# Patient Record
Sex: Female | Born: 1985 | Race: Black or African American | Hispanic: No | Marital: Married | Smoking: Never smoker
Health system: Southern US, Community
[De-identification: ages and names within clinical notes are randomized; demographics above are authoritative.]

## PROBLEM LIST (undated history)

## (undated) DIAGNOSIS — F419 Anxiety disorder, unspecified: Secondary | ICD-10-CM

## (undated) DIAGNOSIS — F329 Major depressive disorder, single episode, unspecified: Secondary | ICD-10-CM

## (undated) DIAGNOSIS — Z789 Other specified health status: Secondary | ICD-10-CM

## (undated) DIAGNOSIS — E119 Type 2 diabetes mellitus without complications: Secondary | ICD-10-CM

## (undated) DIAGNOSIS — F32A Depression, unspecified: Secondary | ICD-10-CM

## (undated) HISTORY — PX: NO PAST SURGERIES: SHX2092

## (undated) HISTORY — DX: Type 2 diabetes mellitus without complications: E11.9

---

## 2013-01-12 ENCOUNTER — Inpatient Hospital Stay (HOSPITAL_COMMUNITY)
Admission: AD | Admit: 2013-01-12 | Discharge: 2013-01-12 | Disposition: A | Discharge status: Home / Self Care | Payer: BC Managed Care – PPO | Source: Ambulatory Visit | Attending: Obstetrics and Gynecology | Admitting: Obstetrics and Gynecology

## 2013-01-12 ENCOUNTER — Encounter (HOSPITAL_COMMUNITY): Payer: Self-pay | Admitting: *Deleted

## 2013-01-12 DIAGNOSIS — O039 Complete or unspecified spontaneous abortion without complication: Secondary | ICD-10-CM | POA: Insufficient documentation

## 2013-01-12 DIAGNOSIS — R109 Unspecified abdominal pain: Secondary | ICD-10-CM | POA: Insufficient documentation

## 2013-01-12 HISTORY — DX: Other specified health status: Z78.9

## 2013-01-12 LAB — CBC
HCT: 39.1 % (ref 36.0–46.0)
MCHC: 33 g/dL (ref 30.0–36.0)
MCV: 79.8 fL (ref 78.0–100.0)
RDW: 13.3 % (ref 11.5–15.5)

## 2013-01-12 MED ORDER — KETOROLAC TROMETHAMINE 60 MG/2ML IM SOLN
60.0000 mg | Freq: Once | INTRAMUSCULAR | Status: AC
  Administered 2013-01-12: 60 mg via INTRAMUSCULAR
  Filled 2013-01-12: qty 2

## 2013-01-12 MED ORDER — HYDROMORPHONE HCL PF 1 MG/ML IJ SOLN
1.0000 mg | Freq: Once | INTRAMUSCULAR | Status: DC
  Filled 2013-01-12: qty 1

## 2013-01-12 MED ORDER — OXYCODONE-ACETAMINOPHEN 5-325 MG PO TABS: 1.0000 | ORAL_TABLET | ORAL | Status: DC | PRN

## 2013-01-12 MED ORDER — HYDROMORPHONE HCL PF 1 MG/ML IJ SOLN
1.0000 mg | Freq: Once | INTRAMUSCULAR | Status: AC
  Administered 2013-01-12: 1 mg via INTRAMUSCULAR

## 2013-01-12 MED ORDER — IBUPROFEN 800 MG PO TABS: 800.0000 mg | ORAL_TABLET | Freq: Three times a day (TID) | ORAL | Status: DC | PRN

## 2013-01-12 NOTE — Progress Notes (Signed)
Lummie and her family are coping as well as they can with this unexpected outcome.  They have a strong faith and good support.  They were appreciative of the support and all of the care that they have received over their time here.  They are aware of resources for support in the community.  [employer] Pager, [phone] 11:17 AM   01/12/13 1100  Clinical Encounter Type  Visited With Patient and family together  Visit Type Spiritual support  Referral From Nurse  Spiritual Encounters  Spiritual Needs Emotional;Grief support  Stress Factors  Patient Stress Factors Loss

## 2013-01-12 NOTE — MAU Provider Note (Signed)
History     CSN: [id number]  Arrival date and time: 01/12/13 1478   None     Chief Complaint  Patient presents with  . Vaginal Bleeding   HPI 27 y.o. G1P0 at [redacted]w[redacted]d with vaginal bleeding starting this morning, heavy with clots, cramping. Pt states she passed sac this morning. U/S about 1 month ago in office showing IUP per patient.   Past Medical History  Diagnosis Date  . Medical history non-contributory     Past Surgical History  Procedure Laterality Date  . No past surgeries      History reviewed. No pertinent family history.  History  Substance Use Topics  . Smoking status: Never Smoker   . Smokeless tobacco: Not on file  . Alcohol Use: No    Allergies: No Known Allergies  Prescriptions prior to admission  Medication Sig Dispense Refill  . Prenatal Vit-Fe Fumarate-FA (PRENATAL MULTIVITAMIN) TABS Take 1 tablet by mouth daily at 12 noon.        Review of Systems  Constitutional: Negative.   HENT: Negative.   Respiratory: Negative.   Cardiovascular: Negative.   Gastrointestinal: Positive for abdominal pain (cramping).  Genitourinary:       + bleeding    Physical Exam   Blood pressure 148/84, pulse 92, temperature 98.2 F (36.8 C), temperature source Oral, resp. rate 22, last menstrual period 10/22/2012, SpO2 99.00%.  Physical Exam  Nursing note and vitals reviewed. Constitutional: She is oriented to person, place, and time. She appears well-developed and well-nourished. She appears distressed.  Cardiovascular: Normal rate.   Respiratory: Effort normal. No respiratory distress.  GI: Soft. There is no tenderness.  Genitourinary: There is bleeding (moderate) around the vagina.  Musculoskeletal: Normal range of motion.  Neurological: She is alert and oriented to person, place, and time.  Skin: Skin is warm and dry.  Psychiatric: She has a normal mood and affect.    MAU Course  Procedures Results for orders placed during the hospital encounter of  01/12/13 (from the past 24 hour(s))  CBC     Status: None   Collection Time    01/12/13 10:05 AM      Result Value Range   WBC 7.1  4.0 - 10.5 K/uL   RBC 4.90  3.87 - 5.11 MIL/uL   Hemoglobin 12.9  12.0 - 15.0 g/dL   HCT 29.5  62.1 - 30.8 %   MCV 79.8  78.0 - 100.0 fL   MCH 26.3  26.0 - 34.0 pg   MCHC 33.0  30.0 - 36.0 g/dL   RDW 65.7  84.6 - 96.2 %   Platelets 344  150 - 400 K/uL  ABO/RH     Status: None   Collection Time    01/12/13 10:05 AM      Result Value Range   ABO/RH(D) O POS     Dilaudid 1 mg IM and Toradol 60 mg IM given in MAU for pain  Pt brought in sac she passed this morning - complete amniotic sac with embryo  Assessment and Plan   1. Complete abortion   Rev'd precautions. Meds as below. F/U in office.     Medication List    TAKE these medications       ibuprofen 800 MG tablet  Commonly known as:  ADVIL,MOTRIN  Take 1 tablet (800 mg total) by mouth every 8 (eight) hours as needed for pain.     oxyCODONE-acetaminophen 5-325 MG per tablet  Commonly known as:  PERCOCET/ROXICET  Take 1 tablet by mouth every 4 (four) hours as needed for pain.     prenatal multivitamin Tabs  Take 1 tablet by mouth daily at 12 noon.            Follow-up Information   Call Loney Laurence, MD.   Contact information:   [address] [place] [address] [phone]         Ady Heimann 01/12/2013, 10:02 AM

## 2013-01-12 NOTE — MAU Note (Signed)
Pt brought to room in [facility], states she had started spotting on Monday, this morning the bleeding was more like a period, passed a large clot while on the way here, pt assisted to change clothes, large clot passed in floor.  Pt also C/O cramping.

## 2013-01-12 NOTE — Progress Notes (Signed)
S: Pt presents for increased vaginal bleeding.  She is 10+0 by a office ultrasound ~ 3-4 weeks ago.  Started with spotting this morning and then cramping and bleeding became worse with passage of clot and tissue.  Upon arrival the patient had passage of a large clot that ended up being POCs en caul. Bleeding now is a better.   OCeasar Mons Vitals:   01/12/13 0941 01/12/13 0947 01/12/13 1023 01/12/13 1108  BP: 148/84  132/78 134/78  Pulse: 92  86 86  Temp: 98.2 F (36.8 C)  98.7 F (37.1 C)   TempSrc: Oral     Resp: 22   18  SpO2:  99% 99%    AOx3, crying Abdomen soft Perineum with blood stain.  Small amount of clot at the introitus. No active vaginal bleeding  CBC    Component Value Date/Time   WBC 7.1 01/12/2013 1005   RBC 4.90 01/12/2013 1005   HGB 12.9 01/12/2013 1005   HCT 39.1 01/12/2013 1005   PLT 344 01/12/2013 1005   MCV 79.8 01/12/2013 1005   MCH 26.3 01/12/2013 1005   MCHC 33.0 01/12/2013 1005   RDW 13.3 01/12/2013 1005    Blood Type O+  A/P 1) Completed abortion 2) Bleeding precautions given 3) D/C home and follow-up in office

## 2013-01-17 ENCOUNTER — Ambulatory Visit (HOSPITAL_COMMUNITY)
Admission: RE | Admit: 2013-01-17 | Discharge: 2013-01-17 | Disposition: A | Discharge status: Home / Self Care | Payer: BC Managed Care – PPO | Source: Ambulatory Visit | Attending: Obstetrics and Gynecology | Admitting: Obstetrics and Gynecology

## 2013-01-17 ENCOUNTER — Other Ambulatory Visit (HOSPITAL_COMMUNITY): Payer: Self-pay | Admitting: Obstetrics and Gynecology

## 2013-01-17 DIAGNOSIS — O034 Incomplete spontaneous abortion without complication: Secondary | ICD-10-CM

## 2013-01-17 DIAGNOSIS — R109 Unspecified abdominal pain: Secondary | ICD-10-CM | POA: Insufficient documentation

## 2013-11-17 ENCOUNTER — Encounter (HOSPITAL_COMMUNITY): Payer: Self-pay | Admitting: *Deleted

## 2014-06-25 ENCOUNTER — Encounter (HOSPITAL_COMMUNITY): Payer: Self-pay | Admitting: *Deleted

## 2014-08-24 NOTE — L&D Delivery Note (Signed)
Patient was C/C/+5 and delivered spontaneously with epidural.     NSVD  female infant without abnormalities, Apgars 2, 2, weight P.   The patient had no lacerations. Fundus was firm. EBL was expected amount. Placenta was delivered intact spontaneously within 30 minutes. Vagina was clear.  Baby was transferred to comfort room and held by relatives until expiration.   Delno Blaisdell A

## 2015-04-26 ENCOUNTER — Other Ambulatory Visit: Payer: Self-pay | Admitting: Obstetrics & Gynecology

## 2015-04-26 LAB — OB RESULTS CONSOLE GC/CHLAMYDIA
Chlamydia: NEGATIVE
Gonorrhea: NEGATIVE

## 2015-04-30 LAB — CYTOLOGY - PAP

## 2015-06-11 LAB — OB RESULTS CONSOLE RUBELLA ANTIBODY, IGM: RUBELLA: IMMUNE

## 2015-06-11 LAB — OB RESULTS CONSOLE HIV ANTIBODY (ROUTINE TESTING): HIV: NONREACTIVE

## 2015-06-11 LAB — OB RESULTS CONSOLE RPR: RPR: NONREACTIVE

## 2015-06-11 LAB — OB RESULTS CONSOLE HEPATITIS B SURFACE ANTIGEN: Hepatitis B Surface Ag: NEGATIVE

## 2015-06-19 ENCOUNTER — Encounter: Payer: Managed Care, Other (non HMO) | Attending: Obstetrics and Gynecology

## 2015-06-19 VITALS — Ht 65.0 in | Wt 240.1 lb

## 2015-06-19 DIAGNOSIS — Z713 Dietary counseling and surveillance: Secondary | ICD-10-CM | POA: Insufficient documentation

## 2015-06-19 DIAGNOSIS — O24112 Pre-existing diabetes mellitus, type 2, in pregnancy, second trimester: Secondary | ICD-10-CM

## 2015-06-19 NOTE — Progress Notes (Signed)
  Patient was seen on 06/19/15 for Diabetes self-management complicated by pregnancy. The following learning objectives were met by the patient :   States the definition of Diabetes  States why dietary management is important in controlling blood glucose  Describes the effects of carbohydrates on blood glucose levels  Demonstrates ability to create a balanced meal plan  Demonstrates carbohydrate counting   States when to check blood glucose levels  Demonstrates proper blood glucose monitoring techniques  States the effect of stress and exercise on blood glucose levels  States the importance of limiting caffeine and abstaining from alcohol and smoking  Plan:  Aim for 2 Carb Choices per meal (30 grams) +/- 1 either way for breakfast Aim for 3 Carb Choices per meal (45 grams) +/- 1 either way from lunch and dinner Aim for 1-2 Carbs per snack Begin reading food labels for Total Carbohydrate and sugar grams of foods Consider  increasing your activity level by walking daily as tolerated Begin checking BG before breakfast and 2 hours after first bit of breakfast, lunch and dinner after  as directed by MD  Take medication  as directed by MD  Blood glucose monitor given: Accu-Chek aviva connect Lot # [id number] Exp: 01/22/16 Blood glucose reading: 293 2hpp eggs and other protein (Dr. Philis Pique notified by phone)  Patient instructed to monitor glucose levels: FBS: 60 - <90 2 hour: <120  Patient received the following handouts:  Nutrition Diabetes and Pregnancy  Carbohydrate Counting List  Meal Planning worksheet  Patient will be seen for follow-up as needed.

## 2015-07-05 ENCOUNTER — Other Ambulatory Visit (HOSPITAL_COMMUNITY): Payer: Self-pay | Admitting: Obstetrics and Gynecology

## 2015-07-05 DIAGNOSIS — Z3A16 16 weeks gestation of pregnancy: Secondary | ICD-10-CM

## 2015-07-05 DIAGNOSIS — O24912 Unspecified diabetes mellitus in pregnancy, second trimester: Secondary | ICD-10-CM

## 2015-07-05 DIAGNOSIS — Z3689 Encounter for other specified antenatal screening: Secondary | ICD-10-CM

## 2015-07-09 ENCOUNTER — Encounter (HOSPITAL_COMMUNITY): Payer: Self-pay

## 2015-07-09 ENCOUNTER — Ambulatory Visit (HOSPITAL_COMMUNITY)
Admission: RE | Admit: 2015-07-09 | Discharge: 2015-07-09 | Disposition: A | Discharge status: Home / Self Care | Payer: Managed Care, Other (non HMO) | Source: Ambulatory Visit | Attending: Obstetrics and Gynecology | Admitting: Obstetrics and Gynecology

## 2015-07-09 ENCOUNTER — Other Ambulatory Visit (HOSPITAL_COMMUNITY): Payer: Self-pay | Admitting: Obstetrics and Gynecology

## 2015-07-09 VITALS — BP 130/80 | HR 89 | Wt 241.4 lb

## 2015-07-09 DIAGNOSIS — IMO0002 Reserved for concepts with insufficient information to code with codable children: Secondary | ICD-10-CM

## 2015-07-09 DIAGNOSIS — O24912 Unspecified diabetes mellitus in pregnancy, second trimester: Secondary | ICD-10-CM

## 2015-07-09 DIAGNOSIS — Z3A16 16 weeks gestation of pregnancy: Secondary | ICD-10-CM

## 2015-07-09 DIAGNOSIS — O24112 Pre-existing diabetes mellitus, type 2, in pregnancy, second trimester: Secondary | ICD-10-CM | POA: Insufficient documentation

## 2015-07-09 DIAGNOSIS — Z3689 Encounter for other specified antenatal screening: Secondary | ICD-10-CM

## 2015-07-09 DIAGNOSIS — E119 Type 2 diabetes mellitus without complications: Secondary | ICD-10-CM | POA: Insufficient documentation

## 2015-07-09 DIAGNOSIS — Z0489 Encounter for examination and observation for other specified reasons: Secondary | ICD-10-CM

## 2015-07-09 DIAGNOSIS — O26879 Cervical shortening, unspecified trimester: Secondary | ICD-10-CM

## 2015-07-09 NOTE — Consult Note (Signed)
Maternal Fetal Medicine Consultation  Requesting Provider(s): Carrington Clamp, MD  Reason for consultation: Diabetes in pregnancy  HPI: Stacy Underwood  Is a 29 yo G2P0010, EDD 12/19/2015 who is currently at 16w 5d who was seen for consultation due to an elevated HbA1C of 10.5% in early pregnancy.  She denies any prior history of diabetes.  Stacy Underwood reports that since the diagnosis she has made some "dramatic changes" in her lifestyle and eating habits.  She reports that she is checking her fasting and fingerstick values (at least 4 x daily) and that they have all been in the target range.  She reports that she has had an 8 lb weight loss since making these changes.The patient did not bring in her fingerstick log for review during our visit.  OB History: OB History    Gravida Para Term Preterm AB TAB SAB Ectopic Multiple Living   2               PMH:  Past Medical History  Diagnosis Date  . Medical history non-contributory   . Diabetes mellitus without complication (HCC)     PSH:  Past Surgical History  Procedure Laterality Date  . No past surgeries     Meds:  Prenatal vitamins  Allergies: No Known Allergies   FH:  Family History  Problem Relation Age of Onset  . Diabetes Other     Soc:  Social History   Social History  . Marital Status: Married    Spouse Name: N/A  . Number of Children: N/A  . Years of Education: N/A   Occupational History  . Not on file.   Social History Main Topics  . Smoking status: Never Smoker   . Smokeless tobacco: Not on file  . Alcohol Use: No  . Drug Use: No  . Sexual Activity: Yes   Other Topics Concern  . Not on file   Social History Narrative    Review of Systems: no vaginal bleeding or cramping/contractions, no LOF, no nausea/vomiting. All other systems reviewed and are negative.  PE:  130/80, 241.4 lbs, 89  GEN: well-appearing female ABD: gravid, NT  Ultrasound: Single IUP at 16w 5d Pre-existing diabetes Limited  views of the fetal heart were obtained due to early gestation The remainder of the fetal anatomy appears normal Normal amniotic fluid volume  TVUS - dynamic changes noted.  Cervical length 2.5 cm to 1.1 cm.   A/P: 1) Single IUP at 16w 5d  2) Pre-existing diabetes - Stacy Underwood had a HbA1C value of 10.5 on 10/18 which is consistent with an estimated average glucose of 255 mg/dl.  Given her early gestational age, this is consistent with a diagnosis of type 2 diabetes and not Gestational diabetes.  Stacy Underwood declined to see our diabetic educator and reports that she is checking her fingerstick values 4 x daily and that her sugars have all been in the target range without treatment. She did not bring in her fingerstick log for review today.  I explained that this simply does not make sense given her high HbA1C value.  She insists that she has made some dramatic changes in her eating habits and lifestyle, but I would not expect that any amount of lifestyle changes would help her achieve euglycemia without treatment given that value.  In general, I would go directly to split dose insulin given this HbA1C value, but based on our conversation feel that she would be very resistant to doing this at this  time.  I offered to repeat the HbA1C value in the event that this were a lab error.  Would at least repeat HbA1C values each trimester to get a better idea of her actual blood sugar control. Given there high HbA1C value, she is at some increased risk for birth defects and would recommend a formal fetal echo.  For now, would recommend weekly fingerstick reviews - if the patient is amendable, would encourage her to bring in her glucometer to review the values directly.  I am fairly certain that she will indeed need treatment, but would be hesitant to start anything without documentation of her fingerstick values.  Stacy Underwood may benefit from a short hospitalization for diabetic teaching and patterning, but would likely be  very resistant to this.  3) Cervical shortening - ultrasound findings were reviewed with the patient.  A prescription for vaginal progesterone was given.  Recommend limiting activity - will reevaluate in one week.  Recommendations: 1) Vaginal progesterone  - 200 mg tablets per vagina nightly  2) Cervical length in 1 week.  If cervical length worsens, would consider ultrasound indicated cervical cerclage 3) Fetal echo at 20-22 weeks 4) Serial ultrasound every 4 weeks for growth and to complete anatomy 5) Antenatal testing beginning at 32 weeks 6) Delivery by 39 weeks in the absence of other complications 7) Would recommend baseline labs - CMP, 24-hr urine protein and creatine clearance given what appears to be pre-existing type 2 diabetes 8) Would check HbA1C values at least each trimester 9) Feel that the patient will need therapy - would encourage her to bring in her fingerstick values each week, ideally with her glucometer to review them directly.  If she is resistant to starting insulin, may offer oral glyburide - but will ultimately likely need to be on insulin.   Thank you for the opportunity to be a part of the care of Stacy Underwood. Please contact our office if we can be of further assistance.   I spent approximately 30 minutes with this patient with over 50% of time spent in face-to-face counseling.  Alpha GulaPaul Jaeda Bruso, MD Maternal Fetal Medicine

## 2015-07-11 ENCOUNTER — Encounter (HOSPITAL_COMMUNITY): Payer: Self-pay

## 2015-07-16 ENCOUNTER — Encounter (HOSPITAL_COMMUNITY): Payer: Self-pay

## 2015-07-16 ENCOUNTER — Inpatient Hospital Stay (HOSPITAL_COMMUNITY): Payer: Managed Care, Other (non HMO) | Admitting: Anesthesiology

## 2015-07-16 ENCOUNTER — Other Ambulatory Visit: Payer: Self-pay

## 2015-07-16 ENCOUNTER — Inpatient Hospital Stay (HOSPITAL_COMMUNITY)
Admission: AD | Admit: 2015-07-16 | Discharge: 2015-07-17 | DRG: 781 | Disposition: A | Discharge status: Home / Self Care | Payer: Managed Care, Other (non HMO) | Source: Ambulatory Visit | Attending: Obstetrics and Gynecology | Admitting: Obstetrics and Gynecology

## 2015-07-16 ENCOUNTER — Ambulatory Visit (HOSPITAL_COMMUNITY)
Admission: RE | Admit: 2015-07-16 | Discharge: 2015-07-16 | Disposition: A | Discharge status: Home / Self Care | Payer: Managed Care, Other (non HMO) | Source: Ambulatory Visit | Attending: Obstetrics and Gynecology | Admitting: Obstetrics and Gynecology

## 2015-07-16 ENCOUNTER — Encounter (HOSPITAL_COMMUNITY): Payer: Self-pay | Admitting: *Deleted

## 2015-07-16 ENCOUNTER — Encounter (HOSPITAL_COMMUNITY)
Admission: AD | Disposition: A | Discharge status: Home / Self Care | Payer: Self-pay | Source: Ambulatory Visit | Attending: Obstetrics and Gynecology

## 2015-07-16 DIAGNOSIS — E1165 Type 2 diabetes mellitus with hyperglycemia: Secondary | ICD-10-CM | POA: Diagnosis present

## 2015-07-16 DIAGNOSIS — O26872 Cervical shortening, second trimester: Secondary | ICD-10-CM | POA: Diagnosis present

## 2015-07-16 DIAGNOSIS — Z3A17 17 weeks gestation of pregnancy: Secondary | ICD-10-CM

## 2015-07-16 DIAGNOSIS — O26879 Cervical shortening, unspecified trimester: Secondary | ICD-10-CM | POA: Diagnosis present

## 2015-07-16 DIAGNOSIS — O24410 Gestational diabetes mellitus in pregnancy, diet controlled: Secondary | ICD-10-CM | POA: Diagnosis not present

## 2015-07-16 DIAGNOSIS — O99212 Obesity complicating pregnancy, second trimester: Secondary | ICD-10-CM | POA: Diagnosis present

## 2015-07-16 DIAGNOSIS — O99820 Streptococcus B carrier state complicating pregnancy: Secondary | ICD-10-CM | POA: Diagnosis present

## 2015-07-16 DIAGNOSIS — O3432 Maternal care for cervical incompetence, second trimester: Secondary | ICD-10-CM | POA: Diagnosis present

## 2015-07-16 DIAGNOSIS — Z6841 Body Mass Index (BMI) 40.0 and over, adult: Secondary | ICD-10-CM

## 2015-07-16 DIAGNOSIS — Z833 Family history of diabetes mellitus: Secondary | ICD-10-CM | POA: Diagnosis not present

## 2015-07-16 DIAGNOSIS — O3430 Maternal care for cervical incompetence, unspecified trimester: Secondary | ICD-10-CM | POA: Diagnosis present

## 2015-07-16 HISTORY — DX: Depression, unspecified: F32.A

## 2015-07-16 HISTORY — DX: Anxiety disorder, unspecified: F41.9

## 2015-07-16 HISTORY — DX: Major depressive disorder, single episode, unspecified: F32.9

## 2015-07-16 HISTORY — PX: CERVICAL CERCLAGE: SHX1329

## 2015-07-16 LAB — TYPE AND SCREEN
ABO/RH(D): O POS
ANTIBODY SCREEN: NEGATIVE

## 2015-07-16 LAB — GLUCOSE, CAPILLARY
GLUCOSE-CAPILLARY: 138 mg/dL — AB (ref 65–99)
Glucose-Capillary: 147 mg/dL — ABNORMAL HIGH (ref 65–99)

## 2015-07-16 LAB — CBC
HCT: 38.6 % (ref 36.0–46.0)
Hemoglobin: 12.7 g/dL (ref 12.0–15.0)
MCH: 26.5 pg (ref 26.0–34.0)
MCHC: 32.9 g/dL (ref 30.0–36.0)
MCV: 80.6 fL (ref 78.0–100.0)
PLATELETS: 369 10*3/uL (ref 150–400)
RBC: 4.79 MIL/uL (ref 3.87–5.11)
RDW: 13.7 % (ref 11.5–15.5)
WBC: 9.2 10*3/uL (ref 4.0–10.5)

## 2015-07-16 LAB — PROTEIN / CREATININE RATIO, URINE
CREATININE, URINE: 97 mg/dL
Protein Creatinine Ratio: 0.11 mg/mg{Cre} (ref 0.00–0.15)
TOTAL PROTEIN, URINE: 11 mg/dL

## 2015-07-16 LAB — T4, FREE: FREE T4: 0.72 ng/dL (ref 0.61–1.12)

## 2015-07-16 LAB — TSH: TSH: 0.496 u[IU]/mL (ref 0.350–4.500)

## 2015-07-16 SURGERY — CERCLAGE, CERVIX, VAGINAL APPROACH
Anesthesia: Spinal

## 2015-07-16 MED ORDER — PRENATAL MULTIVITAMIN CH
1.0000 | ORAL_TABLET | Freq: Every day | ORAL | Status: DC
  Administered 2015-07-17: 1 via ORAL
  Filled 2015-07-16: qty 1

## 2015-07-16 MED ORDER — ONDANSETRON HCL 4 MG/2ML IJ SOLN
4.0000 mg | Freq: Once | INTRAMUSCULAR | Status: DC | PRN
Start: 2015-07-16 — End: 2015-07-17

## 2015-07-16 MED ORDER — PROPOFOL 10 MG/ML IV BOLUS
INTRAVENOUS | Status: AC
  Filled 2015-07-16: qty 40

## 2015-07-16 MED ORDER — ZOLPIDEM TARTRATE 5 MG PO TABS: 5.0000 mg | ORAL_TABLET | Freq: Every evening | ORAL | Status: DC | PRN

## 2015-07-16 MED ORDER — PROPOFOL 500 MG/50ML IV EMUL
INTRAVENOUS | Status: DC | PRN
  Administered 2015-07-16: 75 ug/kg/min via INTRAVENOUS

## 2015-07-16 MED ORDER — LIDOCAINE HCL (CARDIAC) 20 MG/ML IV SOLN
INTRAVENOUS | Status: AC
  Filled 2015-07-16: qty 5

## 2015-07-16 MED ORDER — PROPOFOL 10 MG/ML IV BOLUS
INTRAVENOUS | Status: DC | PRN
  Administered 2015-07-16 (×9): 20 mg via INTRAVENOUS

## 2015-07-16 MED ORDER — INDOMETHACIN 25 MG PO CAPS
25.0000 mg | ORAL_CAPSULE | ORAL | Status: DC
  Administered 2015-07-16 – 2015-07-17 (×6): 25 mg via ORAL
  Filled 2015-07-16 (×7): qty 1

## 2015-07-16 MED ORDER — ACETAMINOPHEN 325 MG PO TABS: 650.0000 mg | ORAL_TABLET | ORAL | Status: DC | PRN

## 2015-07-16 MED ORDER — CEFAZOLIN SODIUM-DEXTROSE 2-3 GM-% IV SOLR
INTRAVENOUS | Status: DC | PRN
  Administered 2015-07-16: 2 g via INTRAVENOUS

## 2015-07-16 MED ORDER — LACTATED RINGERS IV SOLN
INTRAVENOUS | Status: DC
  Administered 2015-07-16 (×2): via INTRAVENOUS

## 2015-07-16 MED ORDER — OXYCODONE-ACETAMINOPHEN 5-325 MG PO TABS: 1.0000 | ORAL_TABLET | ORAL | Status: DC | PRN

## 2015-07-16 MED ORDER — FENTANYL CITRATE (PF) 100 MCG/2ML IJ SOLN: 25.0000 ug | INTRAMUSCULAR | Status: DC | PRN

## 2015-07-16 MED ORDER — CALCIUM CARBONATE ANTACID 500 MG PO CHEW: 2.0000 | CHEWABLE_TABLET | ORAL | Status: DC | PRN

## 2015-07-16 MED ORDER — INDOMETHACIN 50 MG PO CAPS
50.0000 mg | ORAL_CAPSULE | Freq: Once | ORAL | Status: AC
  Administered 2015-07-16: 50 mg via ORAL
  Filled 2015-07-16: qty 1

## 2015-07-16 MED ORDER — DOCUSATE SODIUM 100 MG PO CAPS
100.0000 mg | ORAL_CAPSULE | Freq: Every day | ORAL | Status: DC
  Administered 2015-07-17: 100 mg via ORAL
  Filled 2015-07-16 (×2): qty 1

## 2015-07-16 MED ORDER — CEFAZOLIN SODIUM-DEXTROSE 2-3 GM-% IV SOLR
INTRAVENOUS | Status: AC
  Filled 2015-07-16: qty 50

## 2015-07-16 MED ORDER — BUPIVACAINE IN DEXTROSE 0.75-8.25 % IT SOLN
INTRATHECAL | Status: DC | PRN
  Administered 2015-07-16: 1.4 mL via INTRATHECAL

## 2015-07-16 MED ORDER — LIDOCAINE HCL (CARDIAC) 20 MG/ML IV SOLN
INTRAVENOUS | Status: DC | PRN
  Administered 2015-07-16: 40 mg via INTRAVENOUS

## 2015-07-16 SURGICAL SUPPLY — 19 items
CLOTH BEACON ORANGE TIMEOUT ST (SAFETY) IMPLANT
COUNTER NEEDLE 1200 MAGNETIC (NEEDLE) ×3 IMPLANT
GLOVE BIO SURGEON STRL SZ7 (GLOVE) ×3 IMPLANT
GLOVE BIOGEL PI IND STRL 7.0 (GLOVE) ×1 IMPLANT
GLOVE BIOGEL PI INDICATOR 7.0 (GLOVE) ×2
GOWN STRL REUS W/TWL LRG LVL3 (GOWN DISPOSABLE) ×6 IMPLANT
NEEDLE MAYO CATGUT SZ4 (NEEDLE) ×3 IMPLANT
NS IRRIG 1000ML POUR BTL (IV SOLUTION) IMPLANT
PACK VAGINAL MINOR WOMEN LF (CUSTOM PROCEDURE TRAY) ×3 IMPLANT
PAD OB MATERNITY 4.3X12.25 (PERSONAL CARE ITEMS) ×3 IMPLANT
PAD PREP 24X48 CUFFED NSTRL (MISCELLANEOUS) ×3 IMPLANT
SUT ETHIBOND  5 (SUTURE) ×2
SUT ETHIBOND 5 (SUTURE) ×1 IMPLANT
TOWEL OR 17X24 6PK STRL BLUE (TOWEL DISPOSABLE) ×6 IMPLANT
TRAY FOLEY CATH SILVER 14FR (SET/KITS/TRAYS/PACK) ×3 IMPLANT
TUBING NON-CON 1/4 X 20 CONN (TUBING) ×2 IMPLANT
TUBING NON-CON 1/4 X 20' CONN (TUBING) ×1
WATER STERILE IRR 1000ML POUR (IV SOLUTION) ×3 IMPLANT
YANKAUER SUCT BULB TIP NO VENT (SUCTIONS) ×3 IMPLANT

## 2015-07-16 NOTE — Anesthesia Postprocedure Evaluation (Signed)
Anesthesia Post Note  Patient: Stacy Underwood  Procedure(s) Performed: Procedure(s) (LRB): CERCLAGE CERVICAL (N/A)  Patient location during evaluation: PACU Anesthesia Type: Spinal Level of consciousness: awake and alert Pain management: pain level controlled Vital Signs Assessment: post-procedure vital signs reviewed and stable Respiratory status: spontaneous breathing, nonlabored ventilation, respiratory function stable and patient connected to nasal cannula oxygen Cardiovascular status: blood pressure returned to baseline and stable Postop Assessment: No signs of nausea or vomiting Anesthetic complications: no    Last Vitals:  Filed Vitals:   07/16/15 1800 07/16/15 1813  BP: 123/76 127/76  Pulse: 93 90  Temp: 36.8 C 36.7 C  Resp: 17 18    Last Pain:  Filed Vitals:   07/16/15 1822  PainSc: 0-No pain    LLE Motor Response: Purposeful movement LLE Sensation: Increased RLE Motor Response: Purposeful movement RLE Sensation: Increased      Renelda Kilian JENNETTE

## 2015-07-16 NOTE — Anesthesia Procedure Notes (Signed)
Spinal Patient location during procedure: OR Staffing Anesthesiologist: Jayvier Burgher Performed by: anesthesiologist  Preanesthetic Checklist Completed: patient identified, site marked, surgical consent, pre-op evaluation, timeout performed, IV checked, risks and benefits discussed and monitors and equipment checked Spinal Block Patient position: sitting Prep: DuraPrep Patient monitoring: continuous pulse ox, blood pressure and heart rate Approach: midline Location: L3-4 Injection technique: single-shot Needle Needle type: Sprotte  Needle gauge: 24 G Needle length: 9 cm Additional Notes Functioning IV was confirmed and monitors were applied. Sterile prep and drape, including hand hygiene, mask and sterile gloves were used. The patient was positioned and the spine was prepped. The skin was anesthetized with lidocaine.  Free flow of clear CSF was obtained prior to injecting local anesthetic into the CSF.  The spinal needle aspirated freely following injection.  The needle was carefully withdrawn.  The patient tolerated the procedure well. Consent was obtained prior to procedure with all questions answered and concerns addressed. Risks including but not limited to bleeding, infection, nerve damage, paralysis, failed block, inadequate analgesia, allergic reaction, high spinal, itching and headache were discussed and the patient wished to proceed.   Stacy Faiola, MD     

## 2015-07-16 NOTE — Progress Notes (Signed)
Peripad with scan amt bloody discharge

## 2015-07-16 NOTE — Transfer of Care (Signed)
Immediate Anesthesia Transfer of Care Note  Patient: Stacy PortlandJasmine R Elks  Procedure(s) Performed: Procedure(s): CERCLAGE CERVICAL (N/A)  Patient Location: PACU  Anesthesia Type:MAC and Spinal  Level of Consciousness: awake, alert , oriented and patient cooperative  Airway & Oxygen Therapy: Patient Spontanous Breathing  Post-op Assessment: Report given to RN and Post -op Vital signs reviewed and stable  Post vital signs: Reviewed and stable  Last Vitals:  Filed Vitals:   07/16/15 1217  BP: 124/75  Pulse: 88  Temp: 36.7 C  Resp: 20    Complications: No apparent anesthesia complications

## 2015-07-16 NOTE — Op Note (Addendum)
Pre-Operative Diagnosis: 1) 17+5 week intrauterine pregnancy 2) cervical incompetence 3) uncontrolled diabetes mellitus Postoperative diagnoses: same Procedure: Single McDonald cerclage Surgeon: Dr. Waynard ReedsKendra Hilde Churchman Asst.: None Operative findings: The external cervical os was dilated to 1 cm. Transvaginal ultrasound was performed in the OR at the end of the case and showed a cervical length of 3.34 cm with the cerclage stitch noted 2 cm from the external os. Specimen: None EBL: Minimal  Procedure: 29 year old gravida 2 para [id number] African-American female at 3117 weeks +5 days estimated gestational age presents to to the OR for a emergency cerclage placement. The patient was seen by maternal fetal medicine the week prior and noted to have a shortened cervix at 2.2 cm. Today on repeat ultrasound evaluation her cervix was noted to be 1.7 cm in length with a U-shaped funnel. Given the significant change in cervical length with the decision was made to proceed with emergency cerclage placement. Risks/benefits/alternatives of the procedure were discussed with the patient at length and she agreed to proceed. Following the appropriate informed consent the patient was taken to the operating room where spinal anesthesia is administered and found to be adequate. She was placed in the dorsal lithotomy position in [place] stirrups in Trendelenburg. The perineum and vagina were prepped and draped in the normal sterile fashion. A Foley catheter was placed in the bladder. Ring forceps were used to grasp the anterior lip of the cervix following the placement of a weighted speculum and right angle retractors to provide visualization. A #5 Ethibond suture was placed at 12:00, 9:00, 6:00, 3:00 in a pursestring fashion and the suture was tied down at 1:00. 5 knots, followed by an air knot followed by 5 additional knots were placed. A transvaginal ultrasound was performed in the operating room. The cervical length was noted to be 3.34 cm.  The cerclage was noted to be 2 cm from the external cervical os. This completed the procedure. All sponge, lap, needle counts were correct. The patient tolerated the procedure well and was transferred to the postanesthesia care unit in stable condition following the procedure.

## 2015-07-16 NOTE — Progress Notes (Signed)
Pt to OR via bed accompanied by Dr. Gentry RochJudd.

## 2015-07-16 NOTE — H&P (Signed)
Stacy Underwood is a 29 y.o. female presenting for shortened cervix  29 yo G2P0010 @ 17+5 sent form maternal fetal medicine for a shortened cervix. Dr. Harlon FlorWhitaker evaluated the patient today and noted that her cervical length was shortened to 1.7 cm with a U shaped funnel. One Week ago her cervix was noted to be 2.2 cm. Given the shortening of the cervix Dr. Harlon FlorWhitaker recommended proceeding with emergency cerclage.   Additionally, her HgbA1c was 10.5% when her prenatal labs were drawn. She reports normal accu checks at home History OB History    Gravida Para Term Preterm AB TAB SAB Ectopic Multiple Living   2 0 0 0 1 0 1 0 0 0      Past Medical History  Diagnosis Date  . Medical history non-contributory   . Diabetes mellitus without complication (HCC)   . Anxiety   . Depression    Past Surgical History  Procedure Laterality Date  . No past surgeries     Family History: family history includes Diabetes in her other and paternal grandmother. Social History:  reports that she has never smoked. She has never used smokeless tobacco. She reports that she does not drink alcohol or use illicit drugs.   Prenatal Transfer Tool  Maternal Diabetes: Yes:  Diabetes Type:  Pre-pregnancy Genetic Screening: Normal Maternal Ultrasounds/Referrals: Abnormal:  Findings:   Other: Fetal Ultrasounds or other Referrals:  Short cervix Maternal Substance Abuse:  No Significant Maternal Medications:  None Significant Maternal Lab Results:  None Other Comments:  None  ROS    Blood pressure 124/75, pulse 88, temperature 98 F (36.7 C), temperature source Oral, resp. rate 20, height 5\' 5"  (1.651 m), weight 242 lb (109.77 kg), last menstrual period 02/09/2015, unknown if currently breastfeeding. Exam Physical Exam  Prenatal labs: ABO, Rh:  O positive Antibody:  Neg Rubella:  immune RPR:   NR HBsAg:   Neg HIV:   NR GBS:   pos  Assessment/Plan: 1) Admit 2) Hgba1c, accu checks 3) Discussed R/B/A of  watchful obs versus proceeding with emergency cerclage. Pt wishes to proceed 4) SCDs   Travarus Trudo H. 07/16/2015, 3:39 PM

## 2015-07-16 NOTE — Anesthesia Preprocedure Evaluation (Signed)
Anesthesia Evaluation  Patient identified by MRN, date of birth, ID band Patient awake    Reviewed: Allergy & Precautions, NPO status , Patient's Chart, lab work & pertinent test results  History of Anesthesia Complications Negative for: history of anesthetic complications  Airway Mallampati: II  TM Distance: >3 FB Neck ROM: Full    Dental no notable dental hx. (+) Dental Advisory Given   Pulmonary neg pulmonary ROS,    Pulmonary exam normal breath sounds clear to auscultation       Cardiovascular negative cardio ROS Normal cardiovascular exam Rhythm:Regular Rate:Normal     Neuro/Psych PSYCHIATRIC DISORDERS Anxiety Depression negative neurological ROS     GI/Hepatic negative GI ROS, Neg liver ROS,   Endo/Other  diabetes, Poorly ControlledMorbid obesity  Renal/GU negative Renal ROS  negative genitourinary   Musculoskeletal negative musculoskeletal ROS (+)   Abdominal   Peds negative pediatric ROS (+)  Hematology negative hematology ROS (+)   Anesthesia Other Findings   Reproductive/Obstetrics (+) Pregnancy                             Anesthesia Physical Anesthesia Plan  ASA: III  Anesthesia Plan: Spinal   Post-op Pain Management:    Induction:   Airway Management Planned:   Additional Equipment:   Intra-op Plan:   Post-operative Plan:   Informed Consent: I have reviewed the patients History and Physical, chart, labs and discussed the procedure including the risks, benefits and alternatives for the proposed anesthesia with the patient or authorized representative who has indicated his/her understanding and acceptance.   Dental advisory given  Plan Discussed with: CRNA  Anesthesia Plan Comments:         Anesthesia Quick Evaluation

## 2015-07-17 ENCOUNTER — Encounter (HOSPITAL_COMMUNITY): Payer: Self-pay | Admitting: Obstetrics and Gynecology

## 2015-07-17 LAB — HEMOGLOBIN A1C
Hgb A1c MFr Bld: 8.7 % — ABNORMAL HIGH (ref 4.8–5.6)
Mean Plasma Glucose: 203 mg/dL

## 2015-07-17 LAB — GLUCOSE, CAPILLARY
GLUCOSE-CAPILLARY: 140 mg/dL — AB (ref 65–99)
GLUCOSE-CAPILLARY: 169 mg/dL — AB (ref 65–99)
GLUCOSE-CAPILLARY: 135 mg/dL — AB (ref 65–99)

## 2015-07-17 MED ORDER — INSULIN REGULAR HUMAN 100 UNIT/ML IJ SOLN
19.0000 [IU] | Freq: Every morning | INTRAMUSCULAR | Status: DC
  Administered 2015-07-17: 19 [IU] via SUBCUTANEOUS
  Filled 2015-07-17: qty 0.19

## 2015-07-17 MED ORDER — INSULIN REGULAR HUMAN 100 UNIT/ML IJ SOLN: 14.0000 [IU] | Freq: Every day | INTRAMUSCULAR | Status: DC

## 2015-07-17 MED ORDER — INSULIN NPH (HUMAN) (ISOPHANE) 100 UNIT/ML ~~LOC~~ SUSP: 39.0000 [IU] | Freq: Every day | SUBCUTANEOUS | Status: DC

## 2015-07-17 MED ORDER — INSULIN NPH (HUMAN) (ISOPHANE) 100 UNIT/ML ~~LOC~~ SUSP
14.0000 [IU] | Freq: Every day | SUBCUTANEOUS | Status: DC
  Filled 2015-07-17: qty 10

## 2015-07-17 NOTE — Progress Notes (Addendum)
Inpatient Diabetes Program Recommendations  Diabetes Treatment Program Recommendations  ADA Standards of Care 2016 Diabetes in Pregnancy Target Glucose Ranges:  Fasting: 60 - 90 mg/dL Preprandial: 60 - 161105 mg/dL 1 hr postprandial: Less than 140mg /dL (from first bite of meal) 2 hr postprandial: Less than 120 mg/dL (from first bit of meal)Review of Glycemic Control  Diabetes history: Diabetes in pregnancy Current orders for Inpatient glycemic control: NPH-39 units in the am (scheduled for 10:00 am) and NPH and 14 units at HS       Regular - 19 units in the am (scheduled for 10:00 am) and 14 units with supper.       Total daily dose of 86 units insulin (NPH and Regular)  Inpatient Diabetes Program Recommendations:   Recommend using the NPH bid and novolog meal coverage tidwc and correction 2 hrs post-prandial (while in the hospital, this helps determine what insulin doses need to be adjusted to achieve control before discharge.) According to the "Diabetes in pregnancy order set" using weight and gestational age link, the regimen would look like the following to start:  NPH 15 units (no later than 0800) in the am and 15 units at HS Novolog meal coverage of 1 unit for each 5 gms po intake which typically is a set dose for set carbohydrate intake Breakfast- 30 grams = 6 units Lunch and supper-60 grams = 10 units for both lunch and supper.  The correction insulin is used at the 2 hr PP time,  Checking cbgs: Fasting and 2 hrs PP for each meal, 4 times per day. Using the correction insulin to base either NPH or meal coverage doses. The correction dose in this order set is based on a total daily dose of 92 units on the order set (slightly higher than the regimen using NPH and Reg) I am glad to help enter the orders if requested. Glad to follow patient and assist with dosing recommendations.  Spoke with patient on the phone in her patient room. Patient reports that [facility]  class including carbohydrate counting, glucose goals, exercise. RN's to teach patient how to draw up and administer insulins. Asked patient if she would like for me to come over and talk with her for any other concerns and/or questions. She added that she really did not want me to come once she learned how to use the insulins; patient states she under the impression that she would be using an insulin pen.  Neither NPH nor Regular insulins are made for pen injections. Patient would like to go home asap.  Thank you Lenor CoffinAnn Liem Copenhaver, RN, MSN, CDE  Diabetes Inpatient Program Office: [phone] Pager: [phone] 8:00 am to 5:00 pm

## 2015-07-17 NOTE — Progress Notes (Signed)
I spent time with pt and her SO. Stacy Underwood reported having a strong faith and she is relying on her faith to help ease her mind during the remainder of the pregnancy.  She did not wish to talk further at this time, but is aware of our ongoing availability for spiritual and emotional support.  Chaplain Dyanne CarrelKaty Tajh Livsey, Bcc Pager, [phone] 11:45 AM    07/17/15 1100  Clinical Encounter Type  Visited With Patient and family together  Visit Type Spiritual support;Initial

## 2015-07-17 NOTE — Progress Notes (Signed)
Patient ID: Stacy Underwood, female   DOB: Jan 15, 1986, 29 y.o.   MRN: [id number]   POD#1 s/p emergency single McDonald Cerclage  S: Pt comfortable, denies heavy bleeding or cramping. Blood sugars remain elevated. HgbA1C declined from 10.5 to 8.7, however PP BS all elevated above goal. FBS running 130s O:  Filed Vitals:   07/17/15 0515 07/17/15 0518 07/17/15 0905 07/17/15 1056  BP: 122/74 122/74 127/77   Pulse: 94 93 96   Temp: 98.7 F (37.1 C)  98.9 F (37.2 C)   TempSrc: Oral  Oral   Resp: 18  18   Height:      Weight:    233 lb 6.4 oz (105.87 kg)  SpO2: 100% 99% 99%    AOX3, NAD No accessory work of breathing Abd soft  Results for orders placed or performed during the hospital encounter of 07/16/15 (from the past 24 hour(s))  Type and screen [facility] [place]     Status: None   Collection Time: 07/16/15  2:43 PM  Result Value Ref Range   ABO/RH(D) O POS    Antibody Screen NEG    Sample Expiration 07/19/2015   Hemoglobin A1c     Status: Abnormal   Collection Time: 07/16/15  2:43 PM  Result Value Ref Range   Hgb A1c MFr Bld 8.7 (H) 4.8 - 5.6 %   Mean Plasma Glucose 203 mg/dL  TSH     Status: None   Collection Time: 07/16/15  2:43 PM  Result Value Ref Range   TSH 0.496 0.350 - 4.500 uIU/mL  T4, free     Status: None   Collection Time: 07/16/15  2:43 PM  Result Value Ref Range   Free T4 0.72 0.61 - 1.12 ng/dL  CBC     Status: None   Collection Time: 07/16/15  2:43 PM  Result Value Ref Range   WBC 9.2 4.0 - 10.5 K/uL   RBC 4.79 3.87 - 5.11 MIL/uL   Hemoglobin 12.7 12.0 - 15.0 g/dL   HCT 40.9 81.1 - 91.4 %   MCV 80.6 78.0 - 100.0 fL   MCH 26.5 26.0 - 34.0 pg   MCHC 32.9 30.0 - 36.0 g/dL   RDW 78.2 95.6 - 21.3 %   Platelets 369 150 - 400 K/uL  Protein / creatinine ratio, urine     Status: None   Collection Time: 07/16/15  6:35 PM  Result Value Ref Range   Creatinine, Urine 97.00 mg/dL   Total Protein, Urine 11 mg/dL   Protein Creatinine Ratio 0.11  0.00 - 0.15 mg/mg[Cre]  Glucose, capillary     Status: Abnormal   Collection Time: 07/16/15  9:26 PM  Result Value Ref Range   Glucose-Capillary 147 (H) 65 - 99 mg/dL  Glucose, capillary     Status: Abnormal   Collection Time: 07/17/15  5:18 AM  Result Value Ref Range   Glucose-Capillary 140 (H) 65 - 99 mg/dL  Glucose, capillary     Status: Abnormal   Collection Time: 07/17/15 10:50 AM  Result Value Ref Range   Glucose-Capillary 169 (H) 65 - 99 mg/dL   Comment 1 Notify RN    Comment 2 Document in Chart    A/P: 29 yo G2P0 @ 17+6 s/p emergency single McDonald Cerclage with uncontrolled gestational diabetes 1) Patient started on weight based regimen of insulin. Learning how to give herself injections of insulin. If she can successful inject insulin she can go home.  2) Pt states her BS  here have been higher than at home. I have asked her partner to bring her glucose monitor in to see if it needs to be calibrated 3) Insulin regimen:  Breakfast: NPH 39, R 19  Lunch: 0  Dinner: R 14  Bedtime: NPH 14 4) Discussed Checking BS fasting and 1-2 hours PP. Also asked pt to check some 3am sugars to make sure she's not becoming hypoglycemic in the middle of the night

## 2015-07-17 NOTE — Discharge Summary (Signed)
Obstetric Discharge Summary Reason for Admission: Maternal Fetal Medicine Appointment, Shortened cervical length, need for cerclage Prenatal Procedures: cerclage, ultrasound and insulin therapy Intrapartum Procedures: NA, undelivered Postpartum Procedures: Undelivered Complications-Operative and Postpartum: NA undelivered HEMOGLOBIN  Date Value Ref Range Status  07/16/2015 12.7 12.0 - 15.0 g/dL Final   HCT  Date Value Ref Range Status  07/16/2015 38.6 36.0 - 46.0 % Final   Results for orders placed or performed during the hospital encounter of 07/16/15 (from the past 24 hour(s))  [facility]     Status: None   Collection Time: 07/16/15  2:43 PM  Result Value Ref Range   ABO/RH(D) O POS    Antibody Screen NEG    Sample Expiration 07/19/2015   Hemoglobin A1c     Status: Abnormal   Collection Time: 07/16/15  2:43 PM  Result Value Ref Range   Hgb A1c MFr Bld 8.7 (H) 4.8 - 5.6 %   Mean Plasma Glucose 203 mg/dL  TSH     Status: None   Collection Time: 07/16/15  2:43 PM  Result Value Ref Range   TSH 0.496 0.350 - 4.500 uIU/mL  T4, free     Status: None   Collection Time: 07/16/15  2:43 PM  Result Value Ref Range   Free T4 0.72 0.61 - 1.12 ng/dL  CBC     Status: None   Collection Time: 07/16/15  2:43 PM  Result Value Ref Range   WBC 9.2 4.0 - 10.5 K/uL   RBC 4.79 3.87 - 5.11 MIL/uL   Hemoglobin 12.7 12.0 - 15.0 g/dL   HCT 40.938.6 81.136.0 - 91.446.0 %   MCV 80.6 78.0 - 100.0 fL   MCH 26.5 26.0 - 34.0 pg   MCHC 32.9 30.0 - 36.0 g/dL   RDW 78.213.7 95.611.5 - 21.315.5 %   Platelets 369 150 - 400 K/uL  Protein / creatinine ratio, urine     Status: None   Collection Time: 07/16/15  6:35 PM  Result Value Ref Range   Creatinine, Urine 97.00 mg/dL   Total Protein, Urine 11 mg/dL   Protein Creatinine Ratio 0.11 0.00 - 0.15 mg/mg[Cre]  Glucose, capillary     Status: Abnormal   Collection Time: 07/16/15  9:26 PM  Result Value Ref Range   Glucose-Capillary 147 (H) 65 -  99 mg/dL  Glucose, capillary     Status: Abnormal   Collection Time: 07/17/15  5:18 AM  Result Value Ref Range   Glucose-Capillary 140 (H) 65 - 99 mg/dL  Glucose, capillary     Status: Abnormal   Collection Time: 07/17/15 10:50 AM  Result Value Ref Range   Glucose-Capillary 169 (H) 65 - 99 mg/dL   Comment 1 Notify RN    Comment 2 Document in Chart   Glucose, capillary     Status: Abnormal   Collection Time: 07/17/15  1:31 PM  Result Value Ref Range   Glucose-Capillary 135 (H) 65 - 99 mg/dL   Comment 1 Notify RN    Comment 2 Document in Chart     Physical Exam:  General: alert, cooperative and appears stated age Abd soft  Cvx length post procedure: 3.34cm w/ cerclage stitch 2 cm from external os  Discharge Diagnoses: Incompetent cervix and poorly controlled diabetes mellitus  Discharge Information: Date: 07/17/2015 Activity: pelvic rest Diet: diabetic diet Medications: Insulin NPH 39 QAM, 14 QHS. Regular insulin 19 with breakfast, 14 with dinne4 Condition: improved Instructions: refer to practice specific booklet  Discharge to: home Follow-up Information    Follow up with Almon Hercules., MD On 07/22/2015.   Specialty:  Obstetrics and Gynecology   Why:  at [facility] information:   [address] [place] [address] [phone]        Almon Hercules. 07/17/2015, 2:09 PM

## 2015-07-17 NOTE — Progress Notes (Signed)
Patient taught via demonstration how to inject insulin subqutaneously with an insulin pen and drawing up insulin from a vial. Explained Regular and NPH how to draw up if she needs to inject both. Patient demonstrated back and RN answered questions she asked. Patient reports feeling comfortable injecting self with insulin.

## 2015-07-23 ENCOUNTER — Ambulatory Visit (HOSPITAL_COMMUNITY)
Admission: RE | Admit: 2015-07-23 | Discharge: 2015-07-23 | Disposition: A | Discharge status: Home / Self Care | Payer: Managed Care, Other (non HMO) | Source: Ambulatory Visit | Attending: Obstetrics and Gynecology | Admitting: Obstetrics and Gynecology

## 2015-07-23 VITALS — BP 123/77 | HR 93 | Wt 239.6 lb

## 2015-07-23 DIAGNOSIS — O3432 Maternal care for cervical incompetence, second trimester: Secondary | ICD-10-CM | POA: Insufficient documentation

## 2015-07-23 DIAGNOSIS — O26879 Cervical shortening, unspecified trimester: Secondary | ICD-10-CM | POA: Diagnosis present

## 2015-07-23 DIAGNOSIS — O26872 Cervical shortening, second trimester: Secondary | ICD-10-CM | POA: Diagnosis not present

## 2015-07-23 DIAGNOSIS — Z3A00 Weeks of gestation of pregnancy not specified: Secondary | ICD-10-CM | POA: Diagnosis not present

## 2015-07-26 ENCOUNTER — Other Ambulatory Visit: Payer: Self-pay | Admitting: Obstetrics and Gynecology

## 2015-07-30 ENCOUNTER — Encounter (HOSPITAL_COMMUNITY)
Admission: AD | Disposition: A | Discharge status: Home / Self Care | Payer: Self-pay | Source: Ambulatory Visit | Attending: Obstetrics and Gynecology

## 2015-07-30 ENCOUNTER — Ambulatory Visit (HOSPITAL_COMMUNITY)
Admission: RE | Admit: 2015-07-30 | Discharge: 2015-07-30 | Disposition: A | Discharge status: Home / Self Care | Payer: Managed Care, Other (non HMO) | Source: Ambulatory Visit | Attending: Obstetrics and Gynecology | Admitting: Obstetrics and Gynecology

## 2015-07-30 ENCOUNTER — Encounter (HOSPITAL_COMMUNITY): Payer: Self-pay | Admitting: Anesthesiology

## 2015-07-30 ENCOUNTER — Inpatient Hospital Stay (HOSPITAL_COMMUNITY)
Admission: AD | Admit: 2015-07-30 | Discharge: 2015-08-01 | DRG: 781 | Disposition: A | Discharge status: Home / Self Care | Payer: Managed Care, Other (non HMO) | Source: Ambulatory Visit | Attending: Obstetrics and Gynecology | Admitting: Obstetrics and Gynecology

## 2015-07-30 ENCOUNTER — Inpatient Hospital Stay (HOSPITAL_COMMUNITY): Payer: Managed Care, Other (non HMO) | Admitting: Anesthesiology

## 2015-07-30 ENCOUNTER — Encounter (HOSPITAL_COMMUNITY): Payer: Self-pay | Admitting: *Deleted

## 2015-07-30 DIAGNOSIS — O3432 Maternal care for cervical incompetence, second trimester: Secondary | ICD-10-CM | POA: Diagnosis present

## 2015-07-30 DIAGNOSIS — Z3A19 19 weeks gestation of pregnancy: Secondary | ICD-10-CM

## 2015-07-30 DIAGNOSIS — Z794 Long term (current) use of insulin: Secondary | ICD-10-CM | POA: Diagnosis not present

## 2015-07-30 DIAGNOSIS — O26879 Cervical shortening, unspecified trimester: Secondary | ICD-10-CM

## 2015-07-30 DIAGNOSIS — O24112 Pre-existing diabetes mellitus, type 2, in pregnancy, second trimester: Secondary | ICD-10-CM | POA: Diagnosis present

## 2015-07-30 DIAGNOSIS — E1165 Type 2 diabetes mellitus with hyperglycemia: Secondary | ICD-10-CM | POA: Diagnosis present

## 2015-07-30 DIAGNOSIS — O99212 Obesity complicating pregnancy, second trimester: Secondary | ICD-10-CM | POA: Diagnosis present

## 2015-07-30 DIAGNOSIS — Z6839 Body mass index (BMI) 39.0-39.9, adult: Secondary | ICD-10-CM

## 2015-07-30 HISTORY — PX: CERVICAL CERCLAGE: SHX1329

## 2015-07-30 LAB — CBC WITH DIFFERENTIAL/PLATELET
BASOS ABS: 0 10*3/uL (ref 0.0–0.1)
BASOS PCT: 0 %
EOS PCT: 0 %
Eosinophils Absolute: 0 10*3/uL (ref 0.0–0.7)
HCT: 35.7 % — ABNORMAL LOW (ref 36.0–46.0)
Hemoglobin: 11.8 g/dL — ABNORMAL LOW (ref 12.0–15.0)
LYMPHS PCT: 18 %
Lymphs Abs: 1.6 10*3/uL (ref 0.7–4.0)
MCH: 26.6 pg (ref 26.0–34.0)
MCHC: 33.1 g/dL (ref 30.0–36.0)
MCV: 80.6 fL (ref 78.0–100.0)
MONO ABS: 0.4 10*3/uL (ref 0.1–1.0)
Monocytes Relative: 5 %
Neutro Abs: 6.8 10*3/uL (ref 1.7–7.7)
Neutrophils Relative %: 77 %
PLATELETS: 356 10*3/uL (ref 150–400)
RBC: 4.43 MIL/uL (ref 3.87–5.11)
RDW: 13.6 % (ref 11.5–15.5)
WBC: 8.9 10*3/uL (ref 4.0–10.5)

## 2015-07-30 LAB — GLUCOSE, CAPILLARY
GLUCOSE-CAPILLARY: 96 mg/dL (ref 65–99)
GLUCOSE-CAPILLARY: 109 mg/dL — AB (ref 65–99)
Glucose-Capillary: 150 mg/dL — ABNORMAL HIGH (ref 65–99)
Glucose-Capillary: 165 mg/dL — ABNORMAL HIGH (ref 65–99)
Glucose-Capillary: 138 mg/dL — ABNORMAL HIGH (ref 65–99)
Glucose-Capillary: 115 mg/dL — ABNORMAL HIGH (ref 65–99)
Glucose-Capillary: 107 mg/dL — ABNORMAL HIGH (ref 65–99)
Glucose-Capillary: 101 mg/dL — ABNORMAL HIGH (ref 65–99)
Glucose-Capillary: 118 mg/dL — ABNORMAL HIGH (ref 65–99)

## 2015-07-30 LAB — URINALYSIS, ROUTINE W REFLEX MICROSCOPIC
Bilirubin Urine: NEGATIVE
HGB URINE DIPSTICK: NEGATIVE
KETONES UR: 15 mg/dL — AB
Leukocytes, UA: NEGATIVE
Nitrite: NEGATIVE
PROTEIN: NEGATIVE mg/dL
Specific Gravity, Urine: 1.01 (ref 1.005–1.030)
pH: 7 (ref 5.0–8.0)

## 2015-07-30 LAB — COMPREHENSIVE METABOLIC PANEL
ALT: 17 U/L (ref 14–54)
ANION GAP: 6 (ref 5–15)
AST: 14 U/L — AB (ref 15–41)
Albumin: 3.2 g/dL — ABNORMAL LOW (ref 3.5–5.0)
Alkaline Phosphatase: 76 U/L (ref 38–126)
BUN: 8 mg/dL (ref 6–20)
CHLORIDE: 105 mmol/L (ref 101–111)
CO2: 22 mmol/L (ref 22–32)
Calcium: 9.2 mg/dL (ref 8.9–10.3)
Creatinine, Ser: 0.45 mg/dL (ref 0.44–1.00)
GFR calc Af Amer: >60 mL/min (ref >60)
Glucose, Bld: 159 mg/dL — ABNORMAL HIGH (ref 65–99)
POTASSIUM: 3.9 mmol/L (ref 3.5–5.1)
Sodium: 133 mmol/L — ABNORMAL LOW (ref 135–145)
Total Bilirubin: 0.2 mg/dL — ABNORMAL LOW (ref 0.3–1.2)
Total Protein: 6.3 g/dL — ABNORMAL LOW (ref 6.5–8.1)

## 2015-07-30 LAB — URINE MICROSCOPIC-ADD ON
BACTERIA UA: NONE SEEN
WBC UA: NONE SEEN WBC/hpf (ref 0–5)

## 2015-07-30 LAB — TYPE AND SCREEN
ABO/RH(D): O POS
ANTIBODY SCREEN: NEGATIVE

## 2015-07-30 SURGERY — CERCLAGE, CERVIX, VAGINAL APPROACH
Anesthesia: Spinal | Site: Cervix

## 2015-07-30 MED ORDER — INSULIN ASPART 100 UNIT/ML ~~LOC~~ SOLN: 39.0000 [IU] | Freq: Every day | SUBCUTANEOUS | Status: DC

## 2015-07-30 MED ORDER — PROPOFOL 10 MG/ML IV BOLUS
INTRAVENOUS | Status: DC | PRN
  Administered 2015-07-30: 20 mg via INTRAVENOUS

## 2015-07-30 MED ORDER — FENTANYL CITRATE (PF) 100 MCG/2ML IJ SOLN: 25.0000 ug | INTRAMUSCULAR | Status: DC | PRN

## 2015-07-30 MED ORDER — PRENATAL MULTIVITAMIN CH
1.0000 | ORAL_TABLET | Freq: Every day | ORAL | Status: DC
  Administered 2015-07-31 – 2015-08-01 (×2): 1 via ORAL
  Filled 2015-07-30 (×2): qty 1

## 2015-07-30 MED ORDER — DEXTROSE 5 % IV SOLN
3.0000 g | INTRAVENOUS | Status: DC | PRN
  Administered 2015-07-30: 3 g via INTRAVENOUS

## 2015-07-30 MED ORDER — DEXTROSE 50 % IV SOLN: 25.0000 mL | INTRAVENOUS | Status: DC | PRN

## 2015-07-30 MED ORDER — ONDANSETRON HCL 4 MG/2ML IJ SOLN: 4.0000 mg | Freq: Once | INTRAMUSCULAR | Status: DC | PRN

## 2015-07-30 MED ORDER — FOLIC ACID 1 MG PO TABS
1.0000 mg | ORAL_TABLET | Freq: Every day | ORAL | Status: DC
  Administered 2015-07-30 – 2015-07-31 (×2): 1 mg via ORAL
  Filled 2015-07-30 (×4): qty 1

## 2015-07-30 MED ORDER — SODIUM CHLORIDE 0.9 % IV SOLN: INTRAVENOUS | Status: DC

## 2015-07-30 MED ORDER — KETOCONAZOLE 2 % EX CREA
1.0000 "application " | TOPICAL_CREAM | Freq: Every day | CUTANEOUS | Status: DC | PRN
  Filled 2015-07-30: qty 15

## 2015-07-30 MED ORDER — DEXTROSE 5 % IV SOLN
INTRAVENOUS | Status: AC
  Filled 2015-07-30: qty 3000

## 2015-07-30 MED ORDER — SODIUM CHLORIDE 0.9 % IV SOLN
INTRAVENOUS | Status: DC
  Administered 2015-07-30: 125 mL/h via INTRAVENOUS
  Administered 2015-07-30: 12:00:00 via INTRAVENOUS

## 2015-07-30 MED ORDER — SODIUM CHLORIDE 0.9 % IV SOLN
INTRAVENOUS | Status: DC
Start: 2015-07-30 — End: 2015-07-30

## 2015-07-30 MED ORDER — AMOXICILLIN 500 MG PO CAPS: 500.0000 mg | ORAL_CAPSULE | Freq: Three times a day (TID) | ORAL | Status: DC

## 2015-07-30 MED ORDER — FAMOTIDINE IN NACL 20-0.9 MG/50ML-% IV SOLN
20.0000 mg | Freq: Once | INTRAVENOUS | Status: AC
  Administered 2015-07-30: 20 mg via INTRAVENOUS
  Filled 2015-07-30: qty 50

## 2015-07-30 MED ORDER — AZITHROMYCIN 250 MG PO TABS
500.0000 mg | ORAL_TABLET | Freq: Every day | ORAL | Status: DC
  Administered 2015-07-31 – 2015-08-01 (×2): 500 mg via ORAL
  Filled 2015-07-30 (×2): qty 2

## 2015-07-30 MED ORDER — ACETAMINOPHEN 325 MG PO TABS
650.0000 mg | ORAL_TABLET | ORAL | Status: DC | PRN
  Administered 2015-07-30 – 2015-07-31 (×3): 650 mg via ORAL
  Filled 2015-07-30 (×3): qty 2

## 2015-07-30 MED ORDER — SODIUM CHLORIDE 0.9 % IV SOLN
2.0000 g | Freq: Four times a day (QID) | INTRAVENOUS | Status: DC
  Administered 2015-07-30 – 2015-08-01 (×7): 2 g via INTRAVENOUS
  Filled 2015-07-30 (×8): qty 2000

## 2015-07-30 MED ORDER — INSULIN LISPRO 100 UNIT/ML ~~LOC~~ SOLN: 14.0000 [IU] | SUBCUTANEOUS | Status: DC

## 2015-07-30 MED ORDER — DOCUSATE SODIUM 100 MG PO CAPS
100.0000 mg | ORAL_CAPSULE | Freq: Every day | ORAL | Status: DC
  Administered 2015-07-31 – 2015-08-01 (×2): 100 mg via ORAL
  Filled 2015-07-30 (×2): qty 1

## 2015-07-30 MED ORDER — SODIUM CHLORIDE 0.9 % IJ SOLN: 3.0000 mL | Freq: Two times a day (BID) | INTRAMUSCULAR | Status: DC

## 2015-07-30 MED ORDER — DIPHENHYDRAMINE-APAP (SLEEP) 25-500 MG PO TABS: 1.0000 | ORAL_TABLET | Freq: Every evening | ORAL | Status: DC | PRN

## 2015-07-30 MED ORDER — INDOMETHACIN 25 MG PO CAPS
25.0000 mg | ORAL_CAPSULE | Freq: Two times a day (BID) | ORAL | Status: DC
  Administered 2015-07-31 – 2015-08-01 (×3): 25 mg via ORAL
  Filled 2015-07-30 (×3): qty 1

## 2015-07-30 MED ORDER — PHENAZOPYRIDINE HCL 100 MG PO TABS
200.0000 mg | ORAL_TABLET | Freq: Three times a day (TID) | ORAL | Status: DC
  Administered 2015-07-30 – 2015-08-01 (×5): 200 mg via ORAL
  Filled 2015-07-30 (×6): qty 2

## 2015-07-30 MED ORDER — PRENATAL MULTIVITAMIN CH: 1.0000 | ORAL_TABLET | Freq: Every day | ORAL | Status: DC

## 2015-07-30 MED ORDER — PROPOFOL 500 MG/50ML IV EMUL
INTRAVENOUS | Status: DC | PRN
  Administered 2015-07-30: 50 ug/kg/min via INTRAVENOUS

## 2015-07-30 MED ORDER — PROGESTERONE MICRONIZED 200 MG PO CAPS
200.0000 mg | ORAL_CAPSULE | Freq: Every day | ORAL | Status: AC
  Administered 2015-07-30 – 2015-07-31 (×2): 200 mg via ORAL
  Filled 2015-07-30 (×2): qty 1

## 2015-07-30 MED ORDER — DIPHENHYDRAMINE HCL 25 MG PO CAPS: 25.0000 mg | ORAL_CAPSULE | Freq: Every evening | ORAL | Status: DC | PRN

## 2015-07-30 MED ORDER — CITRIC ACID-SODIUM CITRATE 334-500 MG/5ML PO SOLN
30.0000 mL | Freq: Once | ORAL | Status: AC
  Administered 2015-07-30: 30 mL via ORAL
  Filled 2015-07-30: qty 15

## 2015-07-30 MED ORDER — ZOLPIDEM TARTRATE 5 MG PO TABS
5.0000 mg | ORAL_TABLET | Freq: Every evening | ORAL | Status: DC | PRN
  Administered 2015-07-30: 5 mg via ORAL
  Filled 2015-07-30: qty 1

## 2015-07-30 MED ORDER — INSULIN ASPART 100 UNIT/ML ~~LOC~~ SOLN: 19.0000 [IU] | Freq: Every day | SUBCUTANEOUS | Status: DC

## 2015-07-30 MED ORDER — CALCIUM CARBONATE ANTACID 500 MG PO CHEW: 2.0000 | CHEWABLE_TABLET | ORAL | Status: DC | PRN

## 2015-07-30 MED ORDER — PROPOFOL 10 MG/ML IV BOLUS
INTRAVENOUS | Status: AC
  Filled 2015-07-30: qty 40

## 2015-07-30 MED ORDER — INDOMETHACIN 50 MG RE SUPP
50.0000 mg | Freq: Once | RECTAL | Status: AC
  Administered 2015-07-30: 50 mg via RECTAL
  Filled 2015-07-30: qty 1

## 2015-07-30 MED ORDER — SODIUM CHLORIDE 0.9 % IV SOLN
INTRAVENOUS | Status: DC
  Administered 2015-07-30: 0.8 [IU]/h via INTRAVENOUS
  Filled 2015-07-30: qty 2.5

## 2015-07-30 MED ORDER — SODIUM CHLORIDE 0.9 % IJ SOLN: 3.0000 mL | INTRAMUSCULAR | Status: DC | PRN

## 2015-07-30 MED ORDER — SODIUM CHLORIDE 0.9 % IV SOLN: 250.0000 mL | INTRAVENOUS | Status: DC | PRN

## 2015-07-30 MED ORDER — PROGESTERONE 200 MG VA SUPP: 200.0000 mg | Freq: Every day | VAGINAL | Status: DC

## 2015-07-30 MED ORDER — INSULIN ASPART 100 UNIT/ML ~~LOC~~ SOLN
14.0000 [IU] | Freq: Every day | SUBCUTANEOUS | Status: DC
  Administered 2015-07-30: 14 [IU] via SUBCUTANEOUS

## 2015-07-30 SURGICAL SUPPLY — 31 items
BLADE SURG 11 STRL SS (BLADE) IMPLANT
CATH FOLEY 2WAY SLVR 30CC 16FR (CATHETERS) ×3 IMPLANT
CLOTH BEACON ORANGE TIMEOUT ST (SAFETY) ×3 IMPLANT
COUNTER NEEDLE 1200 MAGNETIC (NEEDLE) IMPLANT
ELECT REM PT RETURN 9FT ADLT (ELECTROSURGICAL) ×3
ELECTRODE REM PT RTRN 9FT ADLT (ELECTROSURGICAL) ×1 IMPLANT
GLOVE BIO SURGEON STRL SZ 6.5 (GLOVE) ×2 IMPLANT
GLOVE BIO SURGEON STRL SZ7 (GLOVE) ×3 IMPLANT
GLOVE BIO SURGEONS STRL SZ 6.5 (GLOVE) ×1
GLOVE BIOGEL PI IND STRL 7.0 (GLOVE) ×3 IMPLANT
GLOVE BIOGEL PI INDICATOR 7.0 (GLOVE) ×6
GOWN STRL REUS W/TWL LRG LVL3 (GOWN DISPOSABLE) ×6 IMPLANT
NEEDLE MAYO CATGUT SZ4 (NEEDLE) IMPLANT
NS IRRIG 1000ML POUR BTL (IV SOLUTION) ×3 IMPLANT
PACK VAGINAL MINOR WOMEN LF (CUSTOM PROCEDURE TRAY) ×3 IMPLANT
PAD OB MATERNITY 4.3X12.25 (PERSONAL CARE ITEMS) ×3 IMPLANT
PAD PREP 24X48 CUFFED NSTRL (MISCELLANEOUS) ×3 IMPLANT
PENCIL BUTTON HOLSTER BLD 10FT (ELECTRODE) ×6 IMPLANT
PLUG CATH AND CAP STER (CATHETERS) ×3 IMPLANT
SUT TICRON 2 BLUE 36 GS-21 (SUTURE) ×6 IMPLANT
SUT VIC AB 2-0 CT1 27 (SUTURE) ×2
SUT VIC AB 2-0 CT1 TAPERPNT 27 (SUTURE) ×1 IMPLANT
SYR 30ML LL (SYRINGE) IMPLANT
SYR BULB IRRIGATION 50ML (SYRINGE) ×3 IMPLANT
SYRINGE 10CC LL (SYRINGE) ×3 IMPLANT
TOWEL OR 17X24 6PK STRL BLUE (TOWEL DISPOSABLE) ×6 IMPLANT
TRAY FOLEY CATH SILVER 14FR (SET/KITS/TRAYS/PACK) ×3 IMPLANT
TUBING NON-CON 1/4 X 20 CONN (TUBING) ×2 IMPLANT
TUBING NON-CON 1/4 X 20' CONN (TUBING) ×1
WATER STERILE IRR 1000ML POUR (IV SOLUTION) ×3 IMPLANT
YANKAUER SUCT BULB TIP NO VENT (SUCTIONS) ×3 IMPLANT

## 2015-07-30 NOTE — Brief Op Note (Signed)
07/30/2015  3:45 PM  PATIENT:  Amie PortlandJasmine R Parkerson  29 y.o. female  PRE-OPERATIVE DIAGNOSIS:  incompetent cervix, prolapsing membranes after first cerclage  POST-OPERATIVE DIAGNOSIS:  incompetent cervix, same, now resolved with cerclage  PROCEDURE:  Procedure(s): CERCLAGE CERVICAL (N/A)  SURGEON:  Surgeon(s) and Role:    * Carrington ClampMichelle Marijo Quizon, MD - Primary   ASSISTANTS: Dr. Alpha GulaPaul Whitecar   ANESTHESIA:   spinal  EBL:  Total I/O In: 200 [I.V.:200] Out: 930 [Urine:900; Blood:30]   LOCAL MEDICATIONS USED:  NONE  SPECIMEN:  No Specimen  DISPOSITION OF SPECIMEN:  N/A  COUNTS:  YES  TOURNIQUET:  * No tourniquets in log *  DICTATION: .Note written in EPIC  PLAN OF CARE: Admit to inpatient   PATIENT DISPOSITION:  PACU - hemodynamically stable.   Delay start of Pharmacological VTE agent (>24hrs) due to surgical blood loss or risk of bleeding: not applicable  Findings:cervix dilated to about 2 cm with prior stitch in place.  Membranes were prolapsing through cervix into vagina without fetal parts.  Post placement membranes returned intact to uterus and cervix about 2-3 cm long anteriorly.  Stitch is located posteriorly.  Technique:  After adequate spinal anesthesia was achieved the pt was examined. The pt was then prepped and draped in usual sterile fashion; a foley was placed and bladder emptied.  The cervix was grasped with a fenestrated rings and the the membranes were reduced with a sponge stick with a glove cover.  An attempt was made to keep the membranes in place with a foley but was unsucessful.  All instruments were removed and the bladder was back filled with about 100cc of saline.  The membranes spontaneously reduced after this and were kept away from the cervis with the glove covered sponge stick.   Dissection was undertaken with metzenbaums of the bladder off the cervix at the level of the reflection and continued for about 3 cm above the os, staying close to the cervix and  away from the bladder.  A 0 double loaded Ticron was then used to go from 12:00 to 9:00 and 9:00 to 6:00 with one end and then 12:00 to 3:00 and 3:00 to 6:00 with the other end.  The stitch was tied down tight as the sponge stick was removed.  It was tied on the POSTERIOR CERVIX with an AIR KNOT for identification.  The bladder flap was closed with two figure of eight stitched of 2-0 vicryl. running stitch of 3-0. The bladder was emptied and the pt returned to recovery room in stable condition.      The pt tolerated the procedure well and was returned to the recovery room in stable condition.

## 2015-07-30 NOTE — H&P (Signed)
29 y.o. 8317w5d  G2P0010 seen at MFM clinic today.  Pt is pregestational diabetes.  Otherwise has good fetal movement and no bleeding.  Past Medical History  Diagnosis Date  . Medical history non-contributory   . Diabetes mellitus without complication (HCC)   . Anxiety   . Depression     Past Surgical History  Procedure Laterality Date  . No past surgeries    . Cervical cerclage N/A 07/16/2015    Procedure: CERCLAGE CERVICAL;  Surgeon: Waynard ReedsKendra Ross, MD;  Location: [facility];  Service: Gynecology;  Laterality: N/A;    OB History  Gravida Para Term Preterm AB SAB TAB Ectopic Multiple Living  2 0 0 0 1 1 0 0 0 0     # Outcome Date GA Lbr Len/2nd Weight Sex Delivery Anes PTL Lv  2 Current           1 SAB              Comments: System Generated. Please review and update pregnancy details.      Social History   Social History  . Marital Status: Married    Spouse Name: N/A  . Number of Children: N/A  . Years of Education: N/A   Occupational History  . Not on file.   Social History Main Topics  . Smoking status: Never Smoker   . Smokeless tobacco: Never Used  . Alcohol Use: No  . Drug Use: No  . Sexual Activity: Yes   Other Topics Concern  . Not on file   Social History Narrative   Review of patient's allergies indicates no known allergies.    Prenatal Transfer Tool  Maternal Diabetes: Yes:  Diabetes Type:  Insulin/Medication controlled Genetic Screening: Normal Maternal Ultrasounds/Referrals: Abnormal:  Findings:   Other:no cervix Fetal Ultrasounds or other Referrals:  Referred to Materal Fetal Medicine  Maternal Substance Abuse:  No Significant Maternal Medications:  Meds include: Other: insulin Significant Maternal Lab Results: None  Other PNC: A1C at first visit over 10.  Pt has been on insulin since.      Filed Vitals:   07/30/15 0915  BP: 122/70  Pulse: 93  Resp: 20     Lungs/Cor:  NAD Abdomen:  soft, gravid Ex:  no cords, erythema SVE:  deferred FHTs:   present Toco:  None seen  US cervix with stitch still in place but membranes beginning to prolapse through.  No measurable cervix.    A/P   29 y.o. G2P0010 5617w5d with BDM and no measurable cervix after cerclage placed last week.  Dr. Sherrie Georgeecker and myself have had extensive discussions with pt about poor current prognosis versus risks of rupture with second surgery for placement of rescue cerclage.  Pt understands risks including loss of pregnancy and wishes to proceed.   1.  Will proceed with rescue cerclage today at 2:30 when pt is 8 hours NPO.  Dr. Claudean SeveranceWhitecar is to help me.  2.  Glucomander for NPO status right now.  Sugar is 150 now.    3.  Trendelenberg and SCDs.  Routine labs.    Stacy Underwood A

## 2015-07-30 NOTE — Anesthesia Postprocedure Evaluation (Signed)
Anesthesia Post Note  Patient: Stacy Underwood  Procedure(s) Performed: Procedure(s) (LRB): CERCLAGE CERVICAL (N/A)  Patient location during evaluation: PACU Anesthesia Type: Spinal Level of consciousness: awake and alert Pain management: pain level controlled Vital Signs Assessment: post-procedure vital signs reviewed and stable Respiratory status: spontaneous breathing, nonlabored ventilation, respiratory function stable and patient connected to nasal cannula oxygen Cardiovascular status: blood pressure returned to baseline and stable Postop Assessment: no signs of nausea or vomiting Anesthetic complications: no    Last Vitals:  Filed Vitals:   07/30/15 1700 07/30/15 1715  BP: 121/79 126/81  Pulse: 87 86  Temp:  36.9 C  Resp: 20 18    Last Pain: There were no vitals filed for this visit.               Rhodesia Stanger JENNETTE

## 2015-07-30 NOTE — Transfer of Care (Signed)
Immediate Anesthesia Transfer of Care Note  Patient: Stacy PortlandJasmine R Wachsmuth  Procedure(s) Performed: Procedure(s): CERCLAGE CERVICAL (N/A)  Patient Location: PACU  Anesthesia Type:Spinal  Level of Consciousness: sedated  Airway & Oxygen Therapy: Patient Spontanous Breathing and Patient connected to face mask oxygen  Post-op Assessment: Report given to RN and Post -op Vital signs reviewed and stable  Post vital signs: Reviewed and stable  Last Vitals:  Filed Vitals:   07/30/15 0915  BP: 122/70  Pulse: 93  Temp: 36.9 C  Resp: 20    Complications: No apparent anesthesia complications

## 2015-07-30 NOTE — Anesthesia Procedure Notes (Signed)
Spinal Patient location during procedure: OR Staffing Anesthesiologist: Vasilios Ottaway Performed by: anesthesiologist  Preanesthetic Checklist Completed: patient identified, site marked, surgical consent, pre-op evaluation, timeout performed, IV checked, risks and benefits discussed and monitors and equipment checked Spinal Block Patient position: sitting Prep: DuraPrep Patient monitoring: continuous pulse ox, blood pressure and heart rate Approach: midline Injection technique: single-shot Needle Needle type: Sprotte  Needle gauge: 24 G Needle length: 9 cm Additional Notes Functioning IV was confirmed and monitors were applied. Sterile prep and drape, including hand hygiene, mask and sterile gloves were used. The patient was positioned and the spine was prepped. The skin was anesthetized with lidocaine.  Free flow of clear CSF was obtained prior to injecting local anesthetic into the CSF.  The spinal needle aspirated freely following injection.  The needle was carefully withdrawn.  The patient tolerated the procedure well. Consent was obtained prior to procedure with all questions answered and concerns addressed. Risks including but not limited to bleeding, infection, nerve damage, paralysis, failed block, inadequate analgesia, allergic reaction, high spinal, itching and headache were discussed and the patient wished to proceed.   Lari Linson, MD     

## 2015-07-30 NOTE — Progress Notes (Signed)
CBG 118 prior to dinner while on insulin pump

## 2015-07-30 NOTE — Plan of Care (Signed)
Problem: Education: Goal: Knowledge of disease or condition will improve Outcome: Progressing Pt aware of surgical plan and need, will continue to discuss care  Problem: Coping: Goal: Level of anxiety will decrease Outcome: Not Progressing Pt anxious about hospitalization and interventions, appropriate for situation

## 2015-07-30 NOTE — Anesthesia Preprocedure Evaluation (Signed)
Anesthesia Evaluation  Patient identified by MRN, date of birth, ID band Patient awake    Reviewed: Allergy & Precautions, NPO status , Patient's Chart, lab work & pertinent test results  History of Anesthesia Complications Negative for: history of anesthetic complications  Airway Mallampati: III  TM Distance: >3 FB Neck ROM: Full    Dental no notable dental hx. (+) Dental Advisory Given   Pulmonary neg pulmonary ROS,    Pulmonary exam normal breath sounds clear to auscultation       Cardiovascular negative cardio ROS Normal cardiovascular exam Rhythm:Regular Rate:Normal     Neuro/Psych PSYCHIATRIC DISORDERS Anxiety Depression negative neurological ROS     GI/Hepatic negative GI ROS, Neg liver ROS,   Endo/Other  diabetesMorbid obesity  Renal/GU negative Renal ROS  negative genitourinary   Musculoskeletal negative musculoskeletal ROS (+)   Abdominal   Peds negative pediatric ROS (+)  Hematology negative hematology ROS (+)   Anesthesia Other Findings   Reproductive/Obstetrics (+) Pregnancy                             Anesthesia Physical Anesthesia Plan  ASA: III  Anesthesia Plan: Spinal   Post-op Pain Management:    Induction:   Airway Management Planned:   Additional Equipment:   Intra-op Plan:   Post-operative Plan:   Informed Consent: I have reviewed the patients History and Physical, chart, labs and discussed the procedure including the risks, benefits and alternatives for the proposed anesthesia with the patient or authorized representative who has indicated his/her understanding and acceptance.   Dental advisory given  Plan Discussed with: CRNA  Anesthesia Plan Comments:         Anesthesia Quick Evaluation

## 2015-07-30 NOTE — Op Note (Signed)
07/30/2015  3:45 PM  PATIENT:  Stacy Underwood  29 y.o. female  PRE-OPERATIVE DIAGNOSIS:  incompetent cervix, prolapsing membranes after first cerclage  POST-OPERATIVE DIAGNOSIS:  incompetent cervix, same, now resolved with cerclage  PROCEDURE:  Procedure(s): CERCLAGE CERVICAL (N/A)  SURGEON:  Surgeon(s) and Role:    * Yamaris Cummings, MD - Primary   ASSISTANTS: Dr. Paul Whitecar   ANESTHESIA:   spinal  EBL:  Total I/O In: 200 [I.V.:200] Out: 930 [Urine:900; Blood:30]   LOCAL MEDICATIONS USED:  NONE  SPECIMEN:  No Specimen  DISPOSITION OF SPECIMEN:  N/A  COUNTS:  YES  TOURNIQUET:  * No tourniquets in log *  DICTATION: .Note written in EPIC  PLAN OF CARE: Admit to inpatient   PATIENT DISPOSITION:  PACU - hemodynamically stable.   Delay start of Pharmacological VTE agent (>24hrs) due to surgical blood loss or risk of bleeding: not applicable  Findings:cervix dilated to about 2 cm with prior stitch in place.  Membranes were prolapsing through cervix into vagina without fetal parts.  Post placement membranes returned intact to uterus and cervix about 2-3 cm long anteriorly.  Stitch is located posteriorly.  Technique:  After adequate spinal anesthesia was achieved the pt was examined. The pt was then prepped and draped in usual sterile fashion; a foley was placed and bladder emptied.  The cervix was grasped with a fenestrated rings and the the membranes were reduced with a sponge stick with a glove cover.  An attempt was made to keep the membranes in place with a foley but was unsucessful.  All instruments were removed and the bladder was back filled with about 100cc of saline.  The membranes spontaneously reduced after this and were kept away from the cervis with the glove covered sponge stick.   Dissection was undertaken with metzenbaums of the bladder off the cervix at the level of the reflection and continued for about 3 cm above the os, staying close to the cervix and  away from the bladder.  A 0 double loaded Ticron was then used to go from 12:00 to 9:00 and 9:00 to 6:00 with one end and then 12:00 to 3:00 and 3:00 to 6:00 with the other end.  The stitch was tied down tight as the sponge stick was removed.  It was tied on the POSTERIOR CERVIX with an AIR KNOT for identification.  The bladder flap was closed with two figure of eight stitched of 2-0 vicryl. running stitch of 3-0. The bladder was emptied and the pt returned to recovery room in stable condition.      The pt tolerated the procedure well and was returned to the recovery room in stable condition.       

## 2015-07-30 NOTE — Progress Notes (Signed)
Inpatient Diabetes Program Recommendations  AACE/ADA: New Consensus Statement on Inpatient Glycemic Control (2015)  Target Ranges:  Prepandial:   less than 140 mg/dL      Peak postprandial:   less than 180 mg/dL (1-2 hours)      Critically ill patients:  140 - 180 mg/dL   Review of Glycemic Control Noted patient on GlucoStabilizer for surgery for cerclage. Glucose elevated at the initiation of the drip. Patient may need some basal insulin added to home regimen. Glad to assist in any way.  Thank you Lenor CoffinAnn Preslea Rhodus, RN, MSN, CDE  Diabetes Inpatient Program Office: [phone] Pager: [phone] 8:00 am to 5:00 pm

## 2015-07-30 NOTE — Progress Notes (Signed)
Pt placed in trendelenburg per MD order.

## 2015-07-31 ENCOUNTER — Encounter (HOSPITAL_COMMUNITY): Payer: Self-pay | Admitting: Obstetrics and Gynecology

## 2015-07-31 LAB — GLUCOSE, CAPILLARY
GLUCOSE-CAPILLARY: 158 mg/dL — AB (ref 65–99)
Glucose-Capillary: 137 mg/dL — ABNORMAL HIGH (ref 65–99)
Glucose-Capillary: 167 mg/dL — ABNORMAL HIGH (ref 65–99)

## 2015-07-31 MED ORDER — INSULIN NPH (HUMAN) (ISOPHANE) 100 UNIT/ML ~~LOC~~ SUSP
39.0000 [IU] | Freq: Every day | SUBCUTANEOUS | Status: DC
  Administered 2015-07-31: 39 [IU] via SUBCUTANEOUS
  Filled 2015-07-31: qty 10

## 2015-07-31 MED ORDER — INSULIN ASPART 100 UNIT/ML ~~LOC~~ SOLN
0.0000 [IU] | Freq: Three times a day (TID) | SUBCUTANEOUS | Status: DC
  Administered 2015-07-31: 6 [IU] via SUBCUTANEOUS
  Administered 2015-07-31: 4 [IU] via SUBCUTANEOUS

## 2015-07-31 MED ORDER — POLYETHYLENE GLYCOL 3350 17 G PO PACK
17.0000 g | PACK | Freq: Two times a day (BID) | ORAL | Status: DC
  Administered 2015-07-31 (×2): 17 g via ORAL
  Filled 2015-07-31 (×2): qty 1

## 2015-07-31 MED ORDER — INSULIN ASPART 100 UNIT/ML ~~LOC~~ SOLN
10.0000 [IU] | Freq: Three times a day (TID) | SUBCUTANEOUS | Status: DC
  Administered 2015-07-31: 10 [IU] via SUBCUTANEOUS
  Administered 2015-07-31: 100 [IU] via SUBCUTANEOUS

## 2015-07-31 MED ORDER — INSULIN NPH (HUMAN) (ISOPHANE) 100 UNIT/ML ~~LOC~~ SUSP
14.0000 [IU] | Freq: Every day | SUBCUTANEOUS | Status: DC
  Administered 2015-08-01: 14 [IU] via SUBCUTANEOUS

## 2015-07-31 NOTE — Progress Notes (Signed)
29 y.o. G2P0010 [redacted]w[redacted]d HD#1 admitted for 19WKS, INCOMPETENT CERVIX incompetent cervix .  Pt currently stable with c/o mild cramping overnight.  Scant bleeding.  Good FM.  On review of insulin dosing, patient has a poor understanding of the difference between long and short acting and insulin and has been taking her insulin incorrectly at home.  She has been taking long acting as prescribed, but her short acting only once daily and not at a set time    BP 141/82 mmHg  Pulse 92  Temp(Src) 98.5 F (36.9 C) (Oral)  Resp 20  Ht  (1.651 m)  Wt 107.956 kg (238 lb)  BMI 39.61 kg/m2  SpO2 98%  LMP 02/09/2015   NAD Abd: soft, gravid, non-tender Ex SCDs FHTs  Present qshift Toco  quiet  Results for orders placed or performed during the hospital encounter of 07/30/15 (from the past 24 hour(s))  Glucose, capillary     Status: Abnormal   Collection Time: 07/30/15  9:29 AM  Result Value Ref Range   Glucose-Capillary 150 (H) 65 - 99 mg/dL   Comment 1 Notify RN    Comment 2 Document in Chart   Urinalysis, Routine w reflex microscopic (not at [facility])     Status: Abnormal   Collection Time: 07/30/15  9:30 AM  Result Value Ref Range   Color, Urine YELLOW YELLOW   APPearance CLEAR CLEAR   Specific Gravity, Urine 1.010 1.005 - 1.030   pH 7.0 5.0 - 8.0   Glucose, UA >1000 (A) NEGATIVE mg/dL   Hgb urine dipstick NEGATIVE NEGATIVE   Bilirubin Urine NEGATIVE NEGATIVE   Ketones, ur 15 (A) NEGATIVE mg/dL   Protein, ur NEGATIVE NEGATIVE mg/dL   Nitrite NEGATIVE NEGATIVE   Leukocytes, UA NEGATIVE NEGATIVE  Urine microscopic-add on     Status: Abnormal   Collection Time: 07/30/15  9:30 AM  Result Value Ref Range   Squamous Epithelial / LPF 0-5 (A) NONE SEEN   WBC, UA NONE SEEN 0 - 5 WBC/hpf   RBC / HPF 0-5 0 - 5 RBC/hpf   Bacteria, UA NONE SEEN NONE SEEN  CBC with Differential/Platelet     Status: Abnormal   Collection Time: 07/30/15 10:06 AM  Result Value Ref Range   WBC 8.9 4.0 - 10.5  K/uL   RBC 4.43 3.87 - 5.11 MIL/uL   Hemoglobin 11.8 (L) 12.0 - 15.0 g/dL   HCT 78.2 (L) 95.6 - 21.3 %   MCV 80.6 78.0 - 100.0 fL   MCH 26.6 26.0 - 34.0 pg   MCHC 33.1 30.0 - 36.0 g/dL   RDW 08.6 57.8 - 46.9 %   Platelets 356 150 - 400 K/uL   Neutrophils Relative % 77 %   Neutro Abs 6.8 1.7 - 7.7 K/uL   Lymphocytes Relative 18 %   Lymphs Abs 1.6 0.7 - 4.0 K/uL   Monocytes Relative 5 %   Monocytes Absolute 0.4 0.1 - 1.0 K/uL   Eosinophils Relative 0 %   Eosinophils Absolute 0.0 0.0 - 0.7 K/uL   Basophils Relative 0 %   Basophils Absolute 0.0 0.0 - 0.1 K/uL  Glucose, capillary     Status: Abnormal   Collection Time: 07/30/15 10:44 AM  Result Value Ref Range   Glucose-Capillary 165 (H) 65 - 99 mg/dL   Comment 1 Notify RN    Comment 2 Document in Chart   Comprehensive metabolic panel     Status: Abnormal   Collection Time: 07/30/15 11:00 AM  Result Value Ref Range   Sodium 133 (L) 135 - 145 mmol/L   Potassium 3.9 3.5 - 5.1 mmol/L   Chloride 105 101 - 111 mmol/L   CO2 22 22 - 32 mmol/L   Glucose, Bld 159 (H) 65 - 99 mg/dL   BUN 8 6 - 20 mg/dL   Creatinine, Ser 1.190.45 0.44 - 1.00 mg/dL   Calcium 9.2 8.9 - 14.710.3 mg/dL   Total Protein 6.3 (L) 6.5 - 8.1 g/dL   Albumin 3.2 (L) 3.5 - 5.0 g/dL   AST 14 (L) 15 - 41 U/L   ALT 17 14 - 54 U/L   Alkaline Phosphatase 76 38 - 126 U/L   Total Bilirubin 0.2 (L) 0.3 - 1.2 mg/dL   GFR calc non Af Amer >60 >60 mL/min   GFR calc Af Amer >60 >60 mL/min   Anion gap 6 5 - 15  Glucose, capillary     Status: Abnormal   Collection Time: 07/30/15 11:34 AM  Result Value Ref Range   Glucose-Capillary 138 (H) 65 - 99 mg/dL   Comment 1 Notify RN    Comment 2 Document in Chart   Glucose, capillary     Status: Abnormal   Collection Time: 07/30/15 12:38 PM  Result Value Ref Range   Glucose-Capillary 115 (H) 65 - 99 mg/dL  Glucose, capillary     Status: Abnormal   Collection Time: 07/30/15  1:31 PM  Result Value Ref Range   Glucose-Capillary 107 (H)  65 - 99 mg/dL  Glucose, capillary     Status: None   Collection Time: 07/30/15  2:34 PM  Result Value Ref Range   Glucose-Capillary 96 65 - 99 mg/dL   Comment 1 Notify RN    Comment 2 Document in Chart   Glucose, capillary     Status: Abnormal   Collection Time: 07/30/15  3:49 PM  Result Value Ref Range   Glucose-Capillary 101 (H) 65 - 99 mg/dL   Comment 1 Document in Chart   [facility]     Status: None   Collection Time: 07/30/15  4:26 PM  Result Value Ref Range   ABO/RH(D) O POS    Antibody Screen NEG    Sample Expiration 08/02/2015   Glucose, capillary     Status: Abnormal   Collection Time: 07/30/15  5:08 PM  Result Value Ref Range   Glucose-Capillary 109 (H) 65 - 99 mg/dL  Glucose, capillary     Status: Abnormal   Collection Time: 07/30/15  6:14 PM  Result Value Ref Range   Glucose-Capillary 118 (H) 65 - 99 mg/dL  Glucose, capillary     Status: Abnormal   Collection Time: 07/31/15  8:04 AM  Result Value Ref Range   Glucose-Capillary 158 (H) 65 - 99 mg/dL   Comment 1 Notify RN    Comment 2 Document in Chart     A:  HD#1  6863w6d POD#1 s/p 2nd rescue cerclage for incompetent cervix and prolapsing membranes Uncontrolled type 2 DM  P: Incompetent cervix--per MFM, plan in house observation for at least 48 hours.  Cont 72 hours of indocin and ppx antibiotics.  No s/sx of intrauterine infection at this time.  Uncontrolled diabetes--significant education given to patient this AM WG:NFAOZre:types of insulin and dosing. While in house, will work to improve glycemic control.  Based on weight based calculation, will continue NPH 39/14 per patient's previous home regimen.  Will also add 10 U novolog tid with  meals.  Patient refuses to speak with the diabetic educator again at this time.  Will cont to provide education from MD/nursing.   [facility] GEFFEL [employer]

## 2015-07-31 NOTE — Addendum Note (Signed)
Addendum  created 07/31/15 0742 by Junious SilkMelinda Leilana Mcquire, CRNA   Modules edited: Clinical Notes   Clinical Notes:  File: [id number]

## 2015-07-31 NOTE — Anesthesia Postprocedure Evaluation (Signed)
Anesthesia Post Note  Patient: Stacy Underwood  Procedure(s) Performed: Procedure(s) (LRB): CERCLAGE CERVICAL (N/A)  Patient location during evaluation: Antenatal Anesthesia Type: Spinal Level of consciousness: awake, awake and alert and oriented Pain management: pain level controlled Vital Signs Assessment: post-procedure vital signs reviewed and stable Respiratory status: spontaneous breathing, nonlabored ventilation and respiratory function stable Cardiovascular status: blood pressure returned to baseline Postop Assessment: no headache, no backache, no signs of nausea or vomiting and adequate PO intake Anesthetic complications: no    Last Vitals:  Filed Vitals:   07/31/15 0545 07/31/15 0556  BP: 141/82   Pulse: 99 92  Temp: 36.9 C   Resp:      Last Pain:  Filed Vitals:   07/31/15 0646  PainSc: Asleep                 Stacy Underwood

## 2015-08-01 LAB — GLUCOSE, CAPILLARY
GLUCOSE-CAPILLARY: 70 mg/dL (ref 65–99)
Glucose-Capillary: 79 mg/dL (ref 65–99)
Glucose-Capillary: 104 mg/dL — ABNORMAL HIGH (ref 65–99)

## 2015-08-01 MED ORDER — SODIUM CHLORIDE 0.9 % IJ SOLN
3.0000 mL | Freq: Two times a day (BID) | INTRAMUSCULAR | Status: DC
  Administered 2015-08-01: 3 mL via INTRAVENOUS

## 2015-08-01 MED ORDER — INSULIN NPH (HUMAN) (ISOPHANE) 100 UNIT/ML ~~LOC~~ SUSP
42.0000 [IU] | Freq: Every day | SUBCUTANEOUS | Status: DC
  Administered 2015-08-01: 42 [IU] via SUBCUTANEOUS

## 2015-08-01 MED ORDER — AMOXICILLIN 500 MG PO CAPS: 500.0000 mg | ORAL_CAPSULE | Freq: Three times a day (TID) | ORAL | Status: AC

## 2015-08-01 MED ORDER — INSULIN ASPART 100 UNIT/ML ~~LOC~~ SOLN: 10.0000 [IU] | Freq: Every day | SUBCUTANEOUS | Status: DC

## 2015-08-01 MED ORDER — SODIUM CHLORIDE 0.9 % IJ SOLN: 3.0000 mL | INTRAMUSCULAR | Status: DC | PRN

## 2015-08-01 MED ORDER — INDOMETHACIN 25 MG PO CAPS: 25.0000 mg | ORAL_CAPSULE | Freq: Two times a day (BID) | ORAL | Status: DC

## 2015-08-01 MED ORDER — INSULIN ASPART 100 UNIT/ML ~~LOC~~ SOLN
12.0000 [IU] | Freq: Every day | SUBCUTANEOUS | Status: DC
  Administered 2015-08-01: 12 [IU] via SUBCUTANEOUS

## 2015-08-01 MED ORDER — INSULIN ASPART 100 UNIT/ML ~~LOC~~ SOLN: 13.0000 [IU] | Freq: Every day | SUBCUTANEOUS | Status: DC

## 2015-08-01 NOTE — Progress Notes (Signed)
BG yesterday 158/137/167/70.  Total daily insulin 83U  Will increase to NPH 42U/14, Novolog 08/05/09 Will adjust PM Novolog after fasting BG drawn this AM.

## 2015-08-01 NOTE — Progress Notes (Signed)
Pt without complaints. Wants to go home. She has no vaginal discharge/bleeding/ctxs. She is still on abx and indocine. BSs improved. ' IMP/ stable Plan/ Will discharge to home this pm. Will continue abxs x 24 and indocin x 24 hrs. She has a appt on tues.

## 2015-08-05 ENCOUNTER — Encounter (HOSPITAL_COMMUNITY): Payer: Self-pay | Admitting: *Deleted

## 2015-08-05 ENCOUNTER — Inpatient Hospital Stay (EMERGENCY_DEPARTMENT_HOSPITAL)
Admission: AD | Admit: 2015-08-05 | Discharge: 2015-08-05 | Disposition: A | Discharge status: Home / Self Care | Payer: Managed Care, Other (non HMO) | Source: Ambulatory Visit | Attending: Obstetrics and Gynecology | Admitting: Obstetrics and Gynecology

## 2015-08-05 DIAGNOSIS — O42919 Preterm premature rupture of membranes, unspecified as to length of time between rupture and onset of labor, unspecified trimester: Secondary | ICD-10-CM | POA: Diagnosis not present

## 2015-08-05 DIAGNOSIS — O9989 Other specified diseases and conditions complicating pregnancy, childbirth and the puerperium: Secondary | ICD-10-CM

## 2015-08-05 DIAGNOSIS — O4702 False labor before 37 completed weeks of gestation, second trimester: Secondary | ICD-10-CM | POA: Diagnosis not present

## 2015-08-05 DIAGNOSIS — O2412 Pre-existing diabetes mellitus, type 2, in childbirth: Secondary | ICD-10-CM | POA: Diagnosis not present

## 2015-08-05 DIAGNOSIS — R109 Unspecified abdominal pain: Secondary | ICD-10-CM

## 2015-08-05 DIAGNOSIS — O26892 Other specified pregnancy related conditions, second trimester: Secondary | ICD-10-CM | POA: Diagnosis not present

## 2015-08-05 DIAGNOSIS — O26899 Other specified pregnancy related conditions, unspecified trimester: Secondary | ICD-10-CM

## 2015-08-05 LAB — URINALYSIS, ROUTINE W REFLEX MICROSCOPIC
Bilirubin Urine: NEGATIVE
Glucose, UA: 500 mg/dL — AB
Ketones, ur: NEGATIVE mg/dL
NITRITE: NEGATIVE
PROTEIN: NEGATIVE mg/dL
Specific Gravity, Urine: 1.02 (ref 1.005–1.030)
pH: 6 (ref 5.0–8.0)

## 2015-08-05 LAB — URINE MICROSCOPIC-ADD ON

## 2015-08-05 MED ORDER — OXYCODONE-ACETAMINOPHEN 5-325 MG PO TABS
1.0000 | ORAL_TABLET | Freq: Once | ORAL | Status: AC
  Administered 2015-08-05: 1 via ORAL
  Filled 2015-08-05: qty 1

## 2015-08-05 MED ORDER — INDOMETHACIN 25 MG PO CAPS: 25.0000 mg | ORAL_CAPSULE | Freq: Two times a day (BID) | ORAL | Status: AC

## 2015-08-05 NOTE — Discharge Instructions (Signed)
Cervical Cerclage Cervical cerclage is a surgical procedure for an incompetent cervix. An incompetent cervix is a weak cervix that opens up before labor begins. Cervical cerclage is a procedure in which the cervix is sewn closed during pregnancy.  LET YOUR HEALTH CARE PROVIDER KNOW ABOUT:   Any allergies you have.  All medicines you are taking, including vitamins, herbs, eye drops, creams, and over-the-counter medicines.  Previous problems you or members of your family have had with the use of anesthetics.  Any blood disorders you have.  Previous surgeries you have had.  Medical conditions you have.  Any recent colds or infections. RISKS AND COMPLICATIONS  Generally, this is a safe procedure. However, as with any procedure, problems can occur. Possible problems include:  Infection.  Bleeding.  Rupturing the amniotic sac (membranes).  Going into early labor and delivery.  Problems with the anesthetics.  Infection of the amniotic sac. BEFORE THE PROCEDURE   Ask your health care provider about changing or stopping your medicines.  Do not eat or drink anything for 6-8 hours before the procedure.  Arrange for someone to drive you home after the procedure. PROCEDURE   An IV tube will be placed in your vein. You will be given a sedative to help you relax.  You will be given a medicine that makes you sleep through the procedure (general anesthetic) or a medicine injected into your spine that numbs your body below the waist (spinal or epidural anesthetic). You will be asleep or be numbed through the entire procedure.  A speculum will be placed in your vagina to visualize your cervix.  The cervix is then grasped and stitched closed tightly.  Ultrasound may be used to guide the procedure and monitor the baby. AFTER THE PROCEDURE   You will go to a recovery room where you and your unborn baby are monitored. Once you are awake, stable, and taking fluids well, you will be allowed  to return to your room.  You will usually stay in the hospital overnight.  You may get an injection of progesterone to prevent uterine contractions.  You may be given pain-relieving medicines to take with you when you go home.  Have someone drive you home and stay with you for up to 2 days.   This information is not intended to replace advice given to you by your health care provider. Make sure you discuss any questions you have with your health care provider.   Document Released: 07/23/2008 Document Revised: 08/15/2013 Document Reviewed: 03/01/2013 Elsevier Interactive Patient Education 2016 Elsevier Inc.  

## 2015-08-05 NOTE — MAU Provider Note (Signed)
History     CSN: [id number]  Arrival date and time: 08/05/15 1358   None     Chief Complaint  Patient presents with  . Pelvic Pain  . vaginal pressure    HPI   Stacy Underwood 29 y.o. G2P0010 @ 3149w4d presents to MAU with hx of 2 rescue cerclage, pink vaginal bleeding when she wipes and vaginal pressure.   Past Medical History  Diagnosis Date  . Medical history non-contributory   . Diabetes mellitus without complication (HCC)   . Anxiety   . Depression     Past Surgical History  Procedure Laterality Date  . No past surgeries    . Cervical cerclage N/A 07/16/2015    Procedure: CERCLAGE CERVICAL;  Surgeon: Waynard ReedsKendra Ross, MD;  Location: [facility];  Service: Gynecology;  Laterality: N/A;  . Cervical cerclage N/A 07/30/2015    Procedure: CERCLAGE CERVICAL;  Surgeon: Carrington ClampMichelle Horvath, MD;  Location: [facility];  Service: Gynecology;  Laterality: N/A;    Family History  Problem Relation Age of Onset  . Diabetes Other   . Diabetes Paternal Grandmother     Social History  Substance Use Topics  . Smoking status: Never Smoker   . Smokeless tobacco: Never Used  . Alcohol Use: No    Allergies: No Known Allergies  Prescriptions prior to admission  Medication Sig Dispense Refill Last Dose  . diphenhydramine-acetaminophen (TYLENOL PM) 25-500 MG TABS tablet Take 1 tablet by mouth at bedtime as needed (sleep).   Past Month at Unknown time  . folic acid (FOLVITE) 800 MCG tablet Take 800 mcg by mouth daily.   07/29/2015 at Unknown time  . indomethacin (INDOCIN) 25 MG capsule Take 1 capsule (25 mg total) by mouth 2 (two) times daily with a meal. 3 capsule 0   . Prenatal Vit-Fe Fumarate-FA (PRENATAL MULTIVITAMIN) TABS Take 1 tablet by mouth daily at 12 noon.   07/29/2015 at Unknown time  . progesterone 200 MG SUPP Place 200 mg vaginally at bedtime.   07/29/2015 at Unknown time    Review of Systems  Gastrointestinal: Positive for abdominal pain.  Genitourinary:       Vaginal pressure    Physical Exam   Blood pressure 135/77, pulse 97, temperature 97.8 F (36.6 C), resp. rate 18, last menstrual period 02/09/2015, unknown if currently breastfeeding.  Physical Exam  Nursing note and vitals reviewed. Constitutional: She is oriented to person, place, and time. She appears well-developed and well-nourished. No distress.  HENT:  Head: Normocephalic and atraumatic.  Neck: Normal range of motion.  Cardiovascular: Normal rate and regular rhythm.   Respiratory: Effort normal and breath sounds normal.  GI: Soft. Bowel sounds are normal.  Genitourinary: Vagina normal.  Cerclage stitch intact; no memebranes seen; no active bleeding seen  Musculoskeletal: Normal range of motion. She exhibits no edema.  Neurological: She is alert and oriented to person, place, and time.  Skin: Skin is warm and dry.  Psychiatric: She has a normal mood and affect. Her behavior is normal. Judgment and thought content normal.   Results for orders placed or performed during the hospital encounter of 08/05/15 (from the past 24 hour(s))  Urinalysis, Routine w reflex microscopic ([facility])     Status: Abnormal   Collection Time: 08/05/15  2:00 PM  Result Value Ref Range   Color, Urine YELLOW YELLOW   APPearance CLEAR CLEAR   Specific Gravity, Urine 1.020 1.005 - 1.030   pH 6.0 5.0 - 8.0   Glucose, UA  500 (A) NEGATIVE mg/dL   Hgb urine dipstick SMALL (A) NEGATIVE   Bilirubin Urine NEGATIVE NEGATIVE   Ketones, ur NEGATIVE NEGATIVE mg/dL   Protein, ur NEGATIVE NEGATIVE mg/dL   Nitrite NEGATIVE NEGATIVE   Leukocytes, UA TRACE (A) NEGATIVE  Urine microscopic-add on     Status: Abnormal   Collection Time: 08/05/15  2:00 PM  Result Value Ref Range   Squamous Epithelial / LPF 6-30 (A) NONE SEEN   WBC, UA 0-5 0 - 5 WBC/hpf   RBC / HPF 0-5 0 - 5 RBC/hpf   Bacteria, UA FEW (A) NONE SEEN   Urine-Other MUCOUS PRESENT    MAU Course  Procedures  MDM Gentle spec exam reveals intact cerclage. EFM  does not show any uterine activity but pt states she is having lower abdominal cramping. Discussed POC with Dr Dareen Piano and pt will be discharged to home with 24 hours of Indocin. Pt needs to make an appointment in the office for this week  Assessment and Plan  Incompetent cervix Abdominal cramping in second trimester Indocin  q 6 hours x 24 hours Discharge   [name],[name] 08/05/2015, 2:20 PM

## 2015-08-05 NOTE — MAU Note (Signed)
Pt presents to MAU with complaints of pelvic pressure. Had a cerclage placed on November the 22nd by Dr Tenny Crawoss and the cerclage was corrected on December the 6th by Dr Henderson CloudHorvath. Pt was discharged from antenatal after cerclage and was discharged on Thursday December the 8th.Reports scant amount of vaginal bleeding when she wipes.

## 2015-08-06 ENCOUNTER — Other Ambulatory Visit (HOSPITAL_COMMUNITY): Payer: Self-pay | Admitting: Maternal and Fetal Medicine

## 2015-08-06 ENCOUNTER — Encounter (HOSPITAL_COMMUNITY): Payer: Self-pay

## 2015-08-06 ENCOUNTER — Ambulatory Visit (HOSPITAL_COMMUNITY)
Admission: RE | Admit: 2015-08-06 | Discharge: 2015-08-06 | Disposition: A | Discharge status: Home / Self Care | Payer: Managed Care, Other (non HMO) | Source: Ambulatory Visit | Attending: Obstetrics and Gynecology | Admitting: Obstetrics and Gynecology

## 2015-08-06 ENCOUNTER — Encounter (HOSPITAL_COMMUNITY): Payer: Self-pay | Admitting: *Deleted

## 2015-08-06 ENCOUNTER — Inpatient Hospital Stay (EMERGENCY_DEPARTMENT_HOSPITAL)
Admission: AD | Admit: 2015-08-06 | Discharge: 2015-08-06 | Disposition: A | Discharge status: Home / Self Care | Payer: Managed Care, Other (non HMO) | Source: Ambulatory Visit | Attending: Obstetrics and Gynecology | Admitting: Obstetrics and Gynecology

## 2015-08-06 DIAGNOSIS — O4692 Antepartum hemorrhage, unspecified, second trimester: Secondary | ICD-10-CM

## 2015-08-06 DIAGNOSIS — O26872 Cervical shortening, second trimester: Secondary | ICD-10-CM

## 2015-08-06 DIAGNOSIS — Z3A20 20 weeks gestation of pregnancy: Secondary | ICD-10-CM

## 2015-08-06 DIAGNOSIS — R109 Unspecified abdominal pain: Secondary | ICD-10-CM

## 2015-08-06 DIAGNOSIS — IMO0002 Reserved for concepts with insufficient information to code with codable children: Secondary | ICD-10-CM

## 2015-08-06 DIAGNOSIS — O24419 Gestational diabetes mellitus in pregnancy, unspecified control: Secondary | ICD-10-CM

## 2015-08-06 DIAGNOSIS — O4702 False labor before 37 completed weeks of gestation, second trimester: Secondary | ICD-10-CM | POA: Diagnosis not present

## 2015-08-06 DIAGNOSIS — O26899 Other specified pregnancy related conditions, unspecified trimester: Secondary | ICD-10-CM

## 2015-08-06 DIAGNOSIS — O3432 Maternal care for cervical incompetence, second trimester: Secondary | ICD-10-CM

## 2015-08-06 DIAGNOSIS — Z0489 Encounter for examination and observation for other specified reasons: Secondary | ICD-10-CM

## 2015-08-06 LAB — GLUCOSE, CAPILLARY: GLUCOSE-CAPILLARY: 121 mg/dL — AB (ref 65–99)

## 2015-08-06 MED ORDER — NIFEDIPINE 10 MG PO CAPS
10.0000 mg | ORAL_CAPSULE | Freq: Once | ORAL | Status: AC
  Administered 2015-08-06: 10 mg via ORAL
  Filled 2015-08-06: qty 1

## 2015-08-06 MED ORDER — NIFEDIPINE 10 MG PO CAPS
20.0000 mg | ORAL_CAPSULE | Freq: Once | ORAL | Status: AC
  Administered 2015-08-06: 20 mg via ORAL
  Filled 2015-08-06: qty 2

## 2015-08-06 MED ORDER — NIFEDIPINE ER OSMOTIC RELEASE 30 MG PO TB24: 30.0000 mg | ORAL_TABLET | Freq: Every day | ORAL | Status: DC

## 2015-08-06 NOTE — Discharge Instructions (Signed)
° ° °Preterm Labor Information °Preterm labor is when labor starts at less than 37 weeks of pregnancy. The normal length of a pregnancy is 39 to 41 weeks. °CAUSES °Often, there is no identifiable underlying cause as to why a woman goes into preterm labor. One of the most common known causes of preterm labor is infection. Infections of the uterus, cervix, vagina, amniotic sac, bladder, kidney, or even the lungs (pneumonia) can cause labor to start. Other suspected causes of preterm labor include:  °· Urogenital infections, such as yeast infections and bacterial vaginosis.   °· Uterine abnormalities (uterine shape, uterine septum, fibroids, or bleeding from the placenta).   °· A cervix that has been operated on (it may fail to stay closed).   °· Malformations in the fetus.   °· Multiple gestations (twins, triplets, and so on).   °· Breakage of the amniotic sac.   °RISK FACTORS °· Having a previous history of preterm labor.   °· Having premature rupture of membranes (PROM).   °· Having a placenta that covers the opening of the cervix (placenta previa).   °· Having a placenta that separates from the uterus (placental abruption).   °· Having a cervix that is too weak to hold the fetus in the uterus (incompetent cervix).   °· Having too much fluid in the amniotic sac (polyhydramnios).   °· Taking illegal drugs or smoking while pregnant.   °· Not gaining enough weight while pregnant.   °· Being younger than 18 and older than 27 years old.   °· Having a low socioeconomic status.   °· Being African American. °SYMPTOMS °Signs and symptoms of preterm labor include:  °· Menstrual-like cramps, abdominal pain, or back pain. °· Uterine contractions that are regular, as frequent as six in an hour, regardless of their intensity (may be mild or painful). °· Contractions that start on the top of the uterus and spread down to the lower abdomen and back.   °· A sense of increased pelvic pressure.   °· A watery or bloody mucus discharge  that comes from the vagina.   °TREATMENT °Depending on the length of the pregnancy and other circumstances, your health care provider may suggest bed rest. If necessary, there are medicines that can be given to stop contractions and to mature the fetal lungs. If labor happens before 34 weeks of pregnancy, a prolonged hospital stay may be recommended. Treatment depends on the condition of both you and the fetus.  °WHAT SHOULD YOU DO IF YOU THINK YOU ARE IN PRETERM LABOR? °Call your health care provider right away. You will need to go to the hospital to get checked immediately. °HOW CAN YOU PREVENT PRETERM LABOR IN FUTURE PREGNANCIES? °You should:  °· Stop smoking if you smoke.  °· Maintain healthy weight gain and avoid chemicals and drugs that are not necessary. °· Be watchful for any type of infection. °· Inform your health care provider if you have a known history of preterm labor. °  °This information is not intended to replace advice given to you by your health care provider. Make sure you discuss any questions you have with your health care provider. °  °Document Released: 10/31/2003 Document Revised: 04/12/2013 Document Reviewed: 09/12/2012 °Elsevier Interactive Patient Education ©2016 Elsevier Inc. °Cervical Insufficiency °Cervical insufficiency is when the cervix is weak and starts to open (dilate) and thin (efface) before the pregnancy is at term and without labor starting. This is also called incompetent cervix. It can happen in the second or third trimester when the fetus starts putting pressure on the cervix. Cervical insufficiency can lead to a   miscarriage, preterm premature rupture of the membranes (PPROM), or having the baby early (preterm birth).  °RISK FACTORS °You may be more likely to develop cervical insufficiency if: °· You have a shorter cervix than normal. °· Damage or injury occurred to your cervix from a past pregnancy or surgery. °· You were born with a cervical defect. °· You have had a  procedure done on the cervix, such as cervical biopsy. °· You have a history of cervical insufficiency. °· You have a history of PPROM. °· You have ended several past pregnancies through abortion. °· You were exposed to the drug diethylstilbestrol (DES). °SYMPTOMS °Often times, women do not have any symptoms. Other times, women may only have mild symptoms that often start between week 14 through 20. The symptoms may last several days or weeks. These symptoms include: °· Light spotting or bleeding from the vagina. °· Pelvic pressure. °· A change in vaginal discharge, such as discharge that changes from clear, white, or light yellow to pink or tan. °· Back pain. °· Abdominal pain or cramping. °DIAGNOSIS °Cervical insufficiency cannot be diagnosed before you become pregnant. Once you are pregnant, your health care provider will ask about your medical history and if you have had any problems in past pregnancies. Tell your health care provider about any procedures performed on your cervix or if you have a history of miscarriages or cervical insufficiency. If your health care provider thinks you are at high risk for cervical insufficiency or show signs of cervical insufficiency, he or she may: °· Perform a pelvic exam. This will check for: °¨ The presence of the membranes (amniotic sac) coming out of the cervix. °¨ Cervical abnormalities. °¨ Cervical injuries. °¨ The presence of contractions. °· Perform an ultrasonography (commonly called ultrasound) to measure the length and thickness of the cervix. °TREATMENT °If you have been diagnosed with cervical insufficiency, your health care provider may recommend: °· Limiting physical activity. °· Bed rest at home or in the hospital. °· Pelvic rest, which means no sexual intercourse or placing anything in the vagina. °· Cerclage to sew the cervix closed and prevent it from opening too early. The stitches (sutures) are removed between weeks 36 and 38 to avoid problems during  labor. °Cerclage may be recommended during pregnancy if you have had a history of miscarriages or preterm births without a known cause. It may also be recommended if you have a short cervix that was identified by ultrasound or if your health care provider has found that your cervix has dilated before 24 weeks of pregnancy. Limiting physical activity and bed rest may or may not help prevent a preterm birth. °WHEN SHOULD YOU SEEK IMMEDIATE MEDICAL CARE?  °Seek immediate medical care if you show any symptoms of cervical insufficiency. You will need to go to the hospital to get checked immediately. °  °This information is not intended to replace advice given to you by your health care provider. Make sure you discuss any questions you have with your health care provider. °  °Document Released: 08/10/2005 Document Revised: 08/31/2014 Document Reviewed: 10/17/2012 °Elsevier Interactive Patient Education ©2016 Elsevier Inc. ° °

## 2015-08-06 NOTE — ED Notes (Signed)
Report called to Ginger Morris, RN, Patient taken to MAU via wheelchair.

## 2015-08-06 NOTE — Discharge Summary (Signed)
Pt is a 29 y/o black female, G2P0010 at [redacted] wks EGA who was seen in the MFM on 07/30/15 for a follow up visit. She was found to have prolapsing membranes through a previously placed cerclage. She was taken to the OR for a second cerclage placement by Dr. Henderson CloudHorvath. Please see Op Note. She tolerated the procedure well. She was txd with Indocine and abxs for 72 hours after placement. While in the hospital her diabetes mgmt was improved. At the time of discharge she had no ctxs, bleeding. Baby was doing well.  She was discharged to home with another 4 days of abxs and 24 hours of indocine.Marland Kitchen. She will follow up in the office.

## 2015-08-06 NOTE — MAU Note (Signed)
Urine in lab 

## 2015-08-06 NOTE — MAU Provider Note (Addendum)
History     CSN: [id number] Arrival date and time: 08/06/15 [id number] First Provider Initiated Contact with Patient 08/06/15 [phone]      Chief Complaint  Patient presents with  . Abdominal Pain   HPI Patient is 29 y.o. G2P0010 865w5d here with complaints of contractions every 15 minutes, constantly since yesterday. She was seen at [facility] and they sent her here. She denies VB, LOF and discharge. She reports FM. She reports she has been complaint with indomethacin but it has not helped. She reports the contractions have been keeping her up.    OB History    Gravida Para Term Preterm AB TAB SAB Ectopic Multiple Living   2 0 0 0 1 0 1 0 0 0       Past Medical History  Diagnosis Date  . Medical history non-contributory   . Diabetes mellitus without complication (HCC)   . Anxiety   . Depression     Past Surgical History  Procedure Laterality Date  . No past surgeries    . Cervical cerclage N/A 07/16/2015    Procedure: CERCLAGE CERVICAL;  Surgeon: Waynard ReedsKendra Ross, MD;  Location: [facility];  Service: Gynecology;  Laterality: N/A;  . Cervical cerclage N/A 07/30/2015    Procedure: CERCLAGE CERVICAL;  Surgeon: Carrington ClampMichelle Nonnie Pickney, MD;  Location: [facility];  Service: Gynecology;  Laterality: N/A;    Family History  Problem Relation Age of Onset  . Diabetes Other   . Diabetes Paternal Grandmother     Social History  Substance Use Topics  . Smoking status: Never Smoker   . Smokeless tobacco: Never Used  . Alcohol Use: No    Allergies: No Known Allergies  Prescriptions prior to admission  Medication Sig Dispense Refill Last Dose  . diphenhydramine-acetaminophen (TYLENOL PM) 25-500 MG TABS tablet Take 1 tablet by mouth at bedtime as needed (sleep).   Taking  . folic acid (FOLVITE) 800 MCG tablet Take 800 mcg by mouth daily.   Taking  . indomethacin (INDOCIN) 25 MG capsule Take 1 capsule (25 mg total) by mouth 2 (two) times daily with a meal. 2 capsule 0 Taking  . Prenatal Vit-Fe Fumarate-FA  (PRENATAL MULTIVITAMIN) TABS Take 1 tablet by mouth daily at 12 noon.   Taking  . progesterone 200 MG SUPP Place 200 mg vaginally at bedtime.   Taking    Review of Systems  Constitutional: Negative for fever and chills.  Eyes: Negative for blurred vision and double vision.  Respiratory: Negative for cough and shortness of breath.   Cardiovascular: Negative for chest pain and orthopnea.  Gastrointestinal: Positive for abdominal pain. Negative for nausea and vomiting.  Genitourinary: Negative for dysuria, frequency and flank pain.  Musculoskeletal: Negative for myalgias.  Skin: Negative for rash.  Neurological: Negative for dizziness, tingling, weakness and headaches.  Endo/Heme/Allergies: Does not bruise/bleed easily.  Psychiatric/Behavioral: Negative for depression and suicidal ideas. The patient is not nervous/anxious.    Physical Exam   Blood pressure 124/75, pulse 92, temperature 98.1 F (36.7 C), temperature source Oral, resp. rate 20, last menstrual period 02/09/2015, unknown if currently breastfeeding.  Physical Exam  Nursing note and vitals reviewed. Constitutional: She is oriented to person, place, and time. She appears well-developed and well-nourished. No distress.  Pregnant female. Painful contraction, breathing through them.   HENT:  Head: Normocephalic and atraumatic.  Eyes: Conjunctivae are normal. No scleral icterus.  Neck: Normal range of motion. Neck supple.  Cardiovascular: Normal rate and intact distal pulses.   Respiratory: Effort normal. She  exhibits no tenderness.  GI: Soft. There is no tenderness. There is no rebound and no guarding.  Gravid  Genitourinary: Vagina normal.  Musculoskeletal: Normal range of motion. She exhibits no edema.  Neurological: She is alert and oriented to person, place, and time.  Skin: Skin is warm and dry. No rash noted.  Psychiatric: She has a normal mood and affect.    MAU Course  Procedures  MDM Procardia  stat with   1 hours later CBG 121 PO bolus (3L)  9:55 AM spoke with Dr. Henderson Cloud. Per MD, she did not have abruption and only needs tocolysis, she spoke with MFM and cerclage is intact and working. Discussed plan for procardia and PO bolus. She agreed with plan. If ctx do not stop with procardia, she would consider terbutaline.  She would also like POC CBG given history of poorly controlled DM.    11:07 AM Evaluated patient,she is feeling better after  procardia and reports less intensity to contractions. She is about tyo receive the  dose.   12:40 PM Evaluated patient . Feeling 100% better. Contractions have stopped.   12:49 PMCalled Dr. Henderson Cloud and updated on plan. She agrees with procardia XL  Assessment and Plan   Stacy Underwood is a 29 y.o. G2P0010 presenting with preterm contractions in the setting of cerclage placement x2 on 11/22 and 12/6 with [place] confirming cerclage today.   #Preterm contractions - s/p 3L PO bolus and procardia with resolution of contractions - home with nifedipine XL  and instructed to take once daily. Discussed increasing to BID if contractions resume - patient will be seen Friday at [facility] office and discussed that they can sent in more medication/officially increase dose if needed at that visit.  - return precautions reviewed and patient voiced understanding - discharge home.  Isa Rankin [facility] 08/06/2015, 9:37 AM

## 2015-08-06 NOTE — MAU Note (Signed)
Pt seen in MAU yesterday for cramping, seen in MFM this a.m., pt states she continues to have cramping.  Denies bleeding or LOF.

## 2015-08-07 ENCOUNTER — Inpatient Hospital Stay (HOSPITAL_COMMUNITY): Payer: Managed Care, Other (non HMO) | Admitting: Anesthesiology

## 2015-08-07 ENCOUNTER — Encounter (HOSPITAL_COMMUNITY): Payer: Self-pay

## 2015-08-07 ENCOUNTER — Inpatient Hospital Stay (HOSPITAL_COMMUNITY)
Admission: AD | Admit: 2015-08-07 | Discharge: 2015-08-10 | DRG: 981 | Disposition: A | Discharge status: Home / Self Care | Payer: Managed Care, Other (non HMO) | Source: Ambulatory Visit | Attending: Obstetrics and Gynecology | Admitting: Obstetrics and Gynecology

## 2015-08-07 DIAGNOSIS — Z833 Family history of diabetes mellitus: Secondary | ICD-10-CM

## 2015-08-07 DIAGNOSIS — Z3A20 20 weeks gestation of pregnancy: Secondary | ICD-10-CM

## 2015-08-07 DIAGNOSIS — O42119 Preterm premature rupture of membranes, onset of labor more than 24 hours following rupture, unspecified trimester: Secondary | ICD-10-CM

## 2015-08-07 DIAGNOSIS — O3432 Maternal care for cervical incompetence, second trimester: Secondary | ICD-10-CM

## 2015-08-07 DIAGNOSIS — O42919 Preterm premature rupture of membranes, unspecified as to length of time between rupture and onset of labor, unspecified trimester: Secondary | ICD-10-CM | POA: Diagnosis not present

## 2015-08-07 DIAGNOSIS — O42912 Preterm premature rupture of membranes, unspecified as to length of time between rupture and onset of labor, second trimester: Secondary | ICD-10-CM | POA: Diagnosis present

## 2015-08-07 DIAGNOSIS — K219 Gastro-esophageal reflux disease without esophagitis: Secondary | ICD-10-CM | POA: Diagnosis present

## 2015-08-07 DIAGNOSIS — Z794 Long term (current) use of insulin: Secondary | ICD-10-CM | POA: Diagnosis not present

## 2015-08-07 DIAGNOSIS — O2412 Pre-existing diabetes mellitus, type 2, in childbirth: Principal | ICD-10-CM | POA: Diagnosis present

## 2015-08-07 DIAGNOSIS — E1165 Type 2 diabetes mellitus with hyperglycemia: Secondary | ICD-10-CM | POA: Diagnosis present

## 2015-08-07 DIAGNOSIS — O411220 Chorioamnionitis, second trimester, not applicable or unspecified: Secondary | ICD-10-CM | POA: Diagnosis present

## 2015-08-07 DIAGNOSIS — O9962 Diseases of the digestive system complicating childbirth: Secondary | ICD-10-CM | POA: Diagnosis present

## 2015-08-07 DIAGNOSIS — O26892 Other specified pregnancy related conditions, second trimester: Secondary | ICD-10-CM | POA: Diagnosis present

## 2015-08-07 LAB — TYPE AND SCREEN
ABO/RH(D): O POS
ANTIBODY SCREEN: NEGATIVE

## 2015-08-07 LAB — CBC
HCT: 38.9 % (ref 36.0–46.0)
HEMOGLOBIN: 13 g/dL (ref 12.0–15.0)
MCH: 27 pg (ref 26.0–34.0)
MCHC: 33.4 g/dL (ref 30.0–36.0)
MCV: 80.7 fL (ref 78.0–100.0)
PLATELETS: 429 10*3/uL — AB (ref 150–400)
RBC: 4.82 MIL/uL (ref 3.87–5.11)
RDW: 13.4 % (ref 11.5–15.5)
WBC: 14.1 10*3/uL — ABNORMAL HIGH (ref 4.0–10.5)

## 2015-08-07 LAB — GLUCOSE, CAPILLARY
Glucose-Capillary: 131 mg/dL — ABNORMAL HIGH (ref 65–99)
Glucose-Capillary: 90 mg/dL (ref 65–99)
Glucose-Capillary: 105 mg/dL — ABNORMAL HIGH (ref 65–99)

## 2015-08-07 LAB — AMNISURE RUPTURE OF MEMBRANE (ROM) NOT AT ARMC: AMNISURE: POSITIVE

## 2015-08-07 LAB — POCT FERN TEST: POCT FERN TEST: NEGATIVE

## 2015-08-07 MED ORDER — OXYTOCIN BOLUS FROM INFUSION
500.0000 mL | INTRAVENOUS | Status: DC
  Administered 2015-08-09: 500 mL via INTRAVENOUS

## 2015-08-07 MED ORDER — ONDANSETRON HCL 4 MG/2ML IJ SOLN
4.0000 mg | Freq: Four times a day (QID) | INTRAMUSCULAR | Status: DC | PRN
  Administered 2015-08-08: 4 mg via INTRAVENOUS
  Filled 2015-08-07: qty 2

## 2015-08-07 MED ORDER — LACTATED RINGERS IV SOLN
500.0000 mL | INTRAVENOUS | Status: DC | PRN
  Administered 2015-08-07: 500 mL via INTRAVENOUS

## 2015-08-07 MED ORDER — FENTANYL 2.5 MCG/ML BUPIVACAINE 1/10 % EPIDURAL INFUSION (WH - ANES)
14.0000 mL/h | INTRAMUSCULAR | Status: DC | PRN
  Administered 2015-08-07 – 2015-08-09 (×7): 14 mL/h via EPIDURAL
  Filled 2015-08-07 (×8): qty 125

## 2015-08-07 MED ORDER — BUTORPHANOL TARTRATE 1 MG/ML IJ SOLN
1.0000 mg | Freq: Once | INTRAMUSCULAR | Status: DC
  Filled 2015-08-07: qty 1

## 2015-08-07 MED ORDER — LIDOCAINE HCL (PF) 1 % IJ SOLN
INTRAMUSCULAR | Status: DC | PRN
  Administered 2015-08-07 (×2): 4 mL via EPIDURAL

## 2015-08-07 MED ORDER — LACTATED RINGERS IV SOLN
INTRAVENOUS | Status: DC
  Administered 2015-08-07: 12:00:00 via INTRAVENOUS

## 2015-08-07 MED ORDER — EPHEDRINE 5 MG/ML INJ
10.0000 mg | INTRAVENOUS | Status: DC | PRN
  Filled 2015-08-07: qty 2

## 2015-08-07 MED ORDER — OXYCODONE-ACETAMINOPHEN 5-325 MG PO TABS: 2.0000 | ORAL_TABLET | ORAL | Status: DC | PRN

## 2015-08-07 MED ORDER — ACETAMINOPHEN 325 MG PO TABS
650.0000 mg | ORAL_TABLET | ORAL | Status: DC | PRN
  Administered 2015-08-09: 650 mg via ORAL
  Filled 2015-08-07: qty 2

## 2015-08-07 MED ORDER — LACTATED RINGERS IV SOLN
INTRAVENOUS | Status: DC
Start: 2015-08-07 — End: 2015-08-09
  Administered 2015-08-07 – 2015-08-08 (×5): via INTRAVENOUS

## 2015-08-07 MED ORDER — OXYCODONE-ACETAMINOPHEN 5-325 MG PO TABS
1.0000 | ORAL_TABLET | ORAL | Status: DC | PRN
Start: 2015-08-07 — End: 2015-08-09

## 2015-08-07 MED ORDER — BUTORPHANOL TARTRATE 1 MG/ML IJ SOLN
1.0000 mg | Freq: Once | INTRAMUSCULAR | Status: AC
  Administered 2015-08-07: 1 mg via INTRAMUSCULAR

## 2015-08-07 MED ORDER — CITRIC ACID-SODIUM CITRATE 334-500 MG/5ML PO SOLN: 30.0000 mL | ORAL | Status: DC | PRN

## 2015-08-07 MED ORDER — PHENYLEPHRINE 40 MCG/ML (10ML) SYRINGE FOR IV PUSH (FOR BLOOD PRESSURE SUPPORT)
80.0000 ug | PREFILLED_SYRINGE | INTRAVENOUS | Status: DC | PRN
  Filled 2015-08-07: qty 2
  Filled 2015-08-07: qty 20

## 2015-08-07 MED ORDER — BUTORPHANOL TARTRATE 1 MG/ML IJ SOLN
1.0000 mg | INTRAMUSCULAR | Status: DC | PRN
Start: 2015-08-07 — End: 2015-08-09

## 2015-08-07 MED ORDER — DIPHENHYDRAMINE HCL 50 MG/ML IJ SOLN
12.5000 mg | INTRAMUSCULAR | Status: DC | PRN
Start: 2015-08-07 — End: 2015-08-09

## 2015-08-07 MED ORDER — OXYTOCIN 40 UNITS IN LACTATED RINGERS INFUSION - SIMPLE MED
62.5000 mL/h | INTRAVENOUS | Status: DC
  Administered 2015-08-09: 62.5 mL/h via INTRAVENOUS
  Filled 2015-08-07: qty 1000

## 2015-08-07 MED ORDER — LIDOCAINE HCL (PF) 1 % IJ SOLN
30.0000 mL | INTRAMUSCULAR | Status: DC | PRN
  Filled 2015-08-07: qty 30

## 2015-08-07 NOTE — Consult Note (Signed)
I came to do Stacy Underwood's consult as requested. As I entered the room, the patient's mom requested the consult be deferred as  Stacy Underwood just got to sleep.  [facility] notified.  Lucillie Garfinkelita Q Emelyn Roen Md Neonatologist

## 2015-08-07 NOTE — Progress Notes (Signed)
Patient ID: Stacy PortlandJasmine R Underwood, female   DOB: [birth year], 29 y.o.   MRN: [id number]  CTSP re abdominal pain. The patient's RN assessed patient earlier and found her to have significant complaints of abdominal pain on palpation. Per The RNs report the patient was experiencing abdominal pain with mild palpation and was tearful. Pt did receive a PCA bolus of her epidural medicine. Upon arrival the patient was in no apparent. Palpation of her abdomen was benign except for a pin point area on the right side of her abdomen. Now she has only slight tenderness and is without rebound or guarding and her abdomen is nontender to palpation. In discussion with the patient she denies that her whole bnelly was tender and her pain is no completely gone.   Will monitor closely for signs/symptoms of chorio It is possible that the epidural bolus is now masking her tenderness If abdominal tenderness returns would proceed with removal of cerclages  Discussed this with the patient and her family. She voiced understanding

## 2015-08-07 NOTE — H&P (Signed)
Stacy Underwood is a 29 y.o. female presenting for leaking fluid  29 year old gravida 2 para 0010 presents at 20 weeks +6 days for leaking fluid. The patient has had a pregnancy complicated by cervical incompetence and uncontrolled pregestational diabetes mellitus. The patient was seen by maternal fetal medicine on 08/06/2015 for a follow-up ultrasound. During that evaluation she was noted to be painfully contracting and she was sent to maternity admissions for evaluation. She received IV fluid hydration and oral Procardia and her contractions dissipated and she was discharged home. Today she reports waking up feeling like she was leaking fluid and she continued to feel wet. Additionally, she was experiencing abdominal pain every 5-10 minutes.  In maternity admissions, the CNM performed a speculum exam. Only a scant amount of fluid pooled in the vagina and the [facility] was not conclusive. Given the patient's convincing story for rupture of membranes and amnisure was performed and returned positive.  I discussed with the patient that the amnisure is positive confirming premature preterm rupture of membranes. Given she is painfully contracting and has 2  [place] cerclage stitches is in place I am concerned that she is laboring and that the stitches will need to be removed in order to avoid further damage to her cervix.   Given and accurately dated pregnancy and that she is remote from viability she is likely progressing toward delivery. Tocolysis in the situation is not recommended. I have confirmed this through telephone conversation with Dr. Sherrie Georgeecker of Maternal Fetal Medicine. I have discussed with the patient that given her gestational age is remote from viability that if a live birth occurs resuscitation of the infant would not be performed due to futility. I have spoken with Dr. Katrinka BlazingSmith, NICU attending. He will consult with the patient once she is settled.  History OB History    Gravida Para Term  Preterm AB TAB SAB Ectopic Multiple Living   2 0 0 0 1 0 1 0 0 0      Past Medical History  Diagnosis Date  . Medical history non-contributory   . Diabetes mellitus without complication (HCC)   . Anxiety   . Depression    Past Surgical History  Procedure Laterality Date  . No past surgeries    . Cervical cerclage N/A 07/16/2015    Procedure: CERCLAGE CERVICAL;  Surgeon: Waynard ReedsKendra Jadore Mcguffin, MD;  Location: [facility];  Service: Gynecology;  Laterality: N/A;  . Cervical cerclage N/A 07/30/2015    Procedure: CERCLAGE CERVICAL;  Surgeon: Carrington ClampMichelle Horvath, MD;  Location: [facility];  Service: Gynecology;  Laterality: N/A;   Family History: family history includes Diabetes in her other and paternal grandmother. Social History:  reports that she has never smoked. She has never used smokeless tobacco. She reports that she does not drink alcohol or use illicit drugs.   Prenatal Transfer Tool  Maternal Diabetes: Yes:  Diabetes Type:  Pre-pregnancy Genetic Screening: Normal Maternal Ultrasounds/Referrals: Abnormal:  Findings:   Other: cervical incompetence Fetal Ultrasounds or other Referrals:  Fetal echo Maternal Substance Abuse:  No Significant Maternal Medications:  Meds include: Progesterone Other: Insulin Significant Maternal Lab Results:  None Other Comments:  None  ROS: as above    Pulse 100, temperature 98.2 F (36.8 C), temperature source Oral, resp. rate 20, height 5\' 5"  (1.651 m), weight 106.142 kg (234 lb), last menstrual period 02/09/2015, SpO2 99 %, unknown if currently breastfeeding. Exam Physical Exam  Prenatal labs: ABO, Rh: --/--/O POS (12/06 1626) Antibody: NEG (12/06 1626) Rubella:  RPR:    HBsAg:    HIV:    GBS:     Assessment/Plan: 1) Admit 2) Epidural 3) Anticipate progression in labor and need for removal of Stitches 4) NICU consult   Roman Sandall H. 08/07/2015, 12:05 PM

## 2015-08-07 NOTE — Anesthesia Procedure Notes (Signed)
Epidural Patient location during procedure: OB Start time: 08/07/2015 12:51 PM  Staffing Anesthesiologist: Mal AmabileFOSTER, Timera Windt Performed by: anesthesiologist   Preanesthetic Checklist Completed: patient identified, site marked, surgical consent, pre-op evaluation, timeout performed, IV checked, risks and benefits discussed and monitors and equipment checked  Epidural Patient position: sitting Prep: site prepped and draped and DuraPrep Patient monitoring: continuous pulse ox and blood pressure Approach: midline Location: L3-L4 Injection technique: LOR air  Needle:  Needle type: Tuohy  Needle gauge: 17 G Needle length: 9 cm and 9 Needle insertion depth: 7 cm Catheter type: closed end flexible Catheter size: 19 Gauge Catheter at skin depth: 12 cm Test dose: negative and Other  Assessment Events: blood not aspirated, injection not painful, no injection resistance, negative IV test and no paresthesia  Additional Notes Patient identified. Risks and benefits discussed including failed block, incomplete  Pain control, post dural puncture headache, nerve damage, paralysis, blood pressure Changes, nausea, vomiting, reactions to medications-both toxic and allergic and post Partum back pain. All questions were answered. Patient expressed understanding and wished to proceed. Sterile technique was used throughout procedure. Epidural site was Dressed with sterile barrier dressing. No paresthesias, signs of intravascular injection Or signs of intrathecal spread were encountered.  Patient was more comfortable after the epidural was dosed. Please see RN's note for documentation of vital signs and FHR which are stable.

## 2015-08-07 NOTE — Anesthesia Preprocedure Evaluation (Signed)
Anesthesia Evaluation  Patient identified by MRN, date of birth, ID band Patient awake    Reviewed: Allergy & Precautions, Patient's Chart, lab work & pertinent test results  Airway Mallampati: II  TM Distance: >3 FB Neck ROM: Full    Dental no notable dental hx. (+) Teeth Intact   Pulmonary neg pulmonary ROS,    Pulmonary exam normal breath sounds clear to auscultation       Cardiovascular negative cardio ROS Normal cardiovascular exam Rhythm:Regular Rate:Normal     Neuro/Psych PSYCHIATRIC DISORDERS Anxiety Depression negative neurological ROS     GI/Hepatic Neg liver ROS, GERD  Medicated and Controlled,  Endo/Other  diabetes, Well Controlled, Gestational  Renal/GU negative Renal ROS  negative genitourinary   Musculoskeletal negative musculoskeletal ROS (+)   Abdominal (+) + obese,   Peds  Hematology negative hematology ROS (+)   Anesthesia Other Findings   Reproductive/Obstetrics (+) Pregnancy 20 6/7 weeks In labor  SROM  Failed rescue cerclage                             Anesthesia Physical Anesthesia Plan  ASA: II  Anesthesia Plan: Epidural   Post-op Pain Management:    Induction:   Airway Management Planned: Natural Airway  Additional Equipment:   Intra-op Plan:   Post-operative Plan:   Informed Consent: I have reviewed the patients History and Physical, chart, labs and discussed the procedure including the risks, benefits and alternatives for the proposed anesthesia with the patient or authorized representative who has indicated his/her understanding and acceptance.     Plan Discussed with: Anesthesiologist  Anesthesia Plan Comments:         Anesthesia Quick Evaluation

## 2015-08-07 NOTE — MAU Provider Note (Signed)
Chief Complaint:  Rupture of Membranes   First Provider Initiated Contact with Patient 08/07/15 1024     HPI  Stacy Underwood is a 29 y.o. G2P0010 at [redacted]w[redacted]d who presents to maternity admissions reporting Leaking of fluid since 0900 and worsening of contractions.   Was seen yesterday and had IV fluids and Procardia which helped. States it stopped helping last night.    She reports good fetal movement, denies vaginal bleeding, vaginal itching/burning, urinary symptoms, h/a, dizziness, n/v, or fever/chills.  She denies headache, visual changes.  RN Note: Pt c/o fluid leaking starting this morning at 0900. Pt has been feeling cramps that are increasing in intensity. Pt has been taking Procardia at home and took last dose at 0330.         Past Medical History: Past Medical History  Diagnosis Date  . Medical history non-contributory   . Diabetes mellitus without complication (HCC)   . Anxiety   . Depression     Past obstetric history: OB History  Gravida Para Term Preterm AB SAB TAB Ectopic Multiple Living     # Outcome Date GA Lbr Len/2nd Weight Sex Delivery Anes PTL Lv  2 Current           1 SAB              Comments: System Generated. Please review and update pregnancy details.      Past Surgical History: Past Surgical History  Procedure Laterality Date  . No past surgeries    . Cervical cerclage N/A 07/16/2015    Procedure: CERCLAGE CERVICAL;  Surgeon: Waynard Reeds, MD;  Location: [facility];  Service: Gynecology;  Laterality: N/A;  . Cervical cerclage N/A 07/30/2015    Procedure: CERCLAGE CERVICAL;  Surgeon: Carrington Clamp, MD;  Location: [facility];  Service: Gynecology;  Laterality: N/A;    Family History: Family History  Problem Relation Age of Onset  . Diabetes Other   . Diabetes Paternal Grandmother     Social History: Social History  Substance Use Topics  . Smoking status: Never Smoker   . Smokeless tobacco: Never Used  . Alcohol Use: No     Allergies: No Known Allergies  Meds:  Prescriptions prior to admission  Medication Sig Dispense Refill Last Dose  . acetaminophen (TYLENOL) 500 MG tablet Take 500 mg by mouth every 6 (six) hours as needed.   08/05/2015 at Unknown time  . folic acid (FOLVITE) 800 MCG tablet Take 800 mcg by mouth daily.   08/05/2015 at Unknown time  . NIFEdipine (PROCARDIA-XL/ADALAT-CC/NIFEDICAL-XL) 30 MG 24 hr tablet Take 1 tablet (30 mg total) by mouth daily. Can increase to twice a day as needed for symptomatic contractions 30 tablet 2   . Prenatal Vit-Fe Fumarate-FA (PRENATAL MULTIVITAMIN) TABS Take 1 tablet by mouth daily at 12 noon.   08/05/2015 at Unknown time  . progesterone 200 MG SUPP Place 200 mg vaginally at bedtime.   08/05/2015 at Unknown time   Review of Systems  Constitutional: Negative for fever and chills.  Respiratory: Negative for shortness of breath.   Gastrointestinal: Positive for abdominal pain. Negative for nausea, vomiting, diarrhea and constipation.  Genitourinary: Positive for vaginal discharge (clear, thin mucous) and pelvic pain. Negative for dysuria and vaginal bleeding.   I have reviewed patient's Past Medical Hx, Surgical Hx, Family Hx, Social Hx, medications and allergies.   Physical Exam  Patient Vitals for the past 24 hrs:  Temp  Temp src Resp Height Weight  08/07/15 1015 98.2 F (36.8 C) Oral 20 5\' 5"  (1.651 m) 106.142 kg (234 lb)   Constitutional: Well-developed, well-nourished female in no acute distress.  Cardiovascular: normal rate and rhythm, no ectopic beats Respiratory: normal effort, clear bilaterally to ascultation GI: Abd soft, non-tender, gravid appropriate for gestational age.  MS: Extremities nontender, no edema, normal ROM Neurologic: Alert and oriented x 4.  GU: Neg CVAT.  PELVIC EXAM: Cervix pink, visually closed, parts of stitch seen.  Small amount of clear fluid seen in posterior fornix.   vaginal walls and external genitalia normal Bimanual  exam: deferred    FHT:  159   Contractions: q 3-4 mins with irritability between   Labs: No results found for this or any previous visit (from the past 24 hour(s)). --/--/O POS (12/06 1626) Results for Amie PortlandBOYD, Agapita R (MRN [id number]) as of 08/07/2015 11:23  Ref. Range 08/07/2015 10:45  Amnisure ROM Unknown POSITIVE   Imaging:   MAU Course/MDM: I have ordered labs and reviewed results. Amnisure sent Consult Dr Tenny Crawoss with presentation, exam findings and test results.  Treatments in MAU included Stadol for pain, IV fluids.  IV difficult to start.    Assessment: No diagnosis found.  Plan: Admit per Dr Tenny Crawoss She has spoken with MFM doctor regarding patient's presentation and plan of care See her notes for complete plan of care    Medication List    ASK your doctor about these medications        acetaminophen 500 MG tablet  Commonly known as:  TYLENOL  Take 500 mg by mouth every 6 (six) hours as needed.     folic acid 800 MCG tablet  Commonly known as:  FOLVITE  Take 800 mcg by mouth daily.     NIFEdipine 30 MG 24 hr tablet  Commonly known as:  PROCARDIA-XL/ADALAT-CC/NIFEDICAL-XL  Take 1 tablet (30 mg total) by mouth daily. Can increase to twice a day as needed for symptomatic contractions     prenatal multivitamin Tabs tablet  Take 1 tablet by mouth daily at 12 noon.     progesterone 200 MG Supp  Place 200 mg vaginally at bedtime.        Wynelle BourgeoisMarie Kimothy Kishimoto CNM, MSN Certified Nurse-Midwife 08/07/2015 10:48 AM

## 2015-08-07 NOTE — MAU Note (Signed)
Pt c/o fluid leaking starting this morning at 0900. Pt has been feeling cramps that are increasing in intensity. Pt has been taking Procardia at home and took last dose at 0330.

## 2015-08-08 LAB — CBC WITH DIFFERENTIAL/PLATELET
Basophils Absolute: 0 10*3/uL (ref 0.0–0.1)
Basophils Relative: 0 %
EOS PCT: 0 %
Eosinophils Absolute: 0.1 10*3/uL (ref 0.0–0.7)
HCT: 32.5 % — ABNORMAL LOW (ref 36.0–46.0)
Hemoglobin: 10.6 g/dL — ABNORMAL LOW (ref 12.0–15.0)
LYMPHS ABS: 2.4 10*3/uL (ref 0.7–4.0)
LYMPHS PCT: 15 %
MCH: 26.6 pg (ref 26.0–34.0)
MCHC: 32.6 g/dL (ref 30.0–36.0)
MCV: 81.7 fL (ref 78.0–100.0)
MONO ABS: 1 10*3/uL (ref 0.1–1.0)
MONOS PCT: 6 %
Neutro Abs: 12.9 10*3/uL — ABNORMAL HIGH (ref 1.7–7.7)
Neutrophils Relative %: 79 %
PLATELETS: 339 10*3/uL (ref 150–400)
RBC: 3.98 MIL/uL (ref 3.87–5.11)
RDW: 13.5 % (ref 11.5–15.5)
WBC: 16.3 10*3/uL — ABNORMAL HIGH (ref 4.0–10.5)

## 2015-08-08 LAB — GLUCOSE, CAPILLARY
GLUCOSE-CAPILLARY: 142 mg/dL — AB (ref 65–99)
GLUCOSE-CAPILLARY: 94 mg/dL (ref 65–99)
Glucose-Capillary: 121 mg/dL — ABNORMAL HIGH (ref 65–99)
Glucose-Capillary: 162 mg/dL — ABNORMAL HIGH (ref 65–99)
Glucose-Capillary: 105 mg/dL — ABNORMAL HIGH (ref 65–99)

## 2015-08-08 LAB — RPR: RPR: NONREACTIVE

## 2015-08-08 MED ORDER — PROMETHAZINE HCL 25 MG/ML IJ SOLN
25.0000 mg | Freq: Four times a day (QID) | INTRAMUSCULAR | Status: DC | PRN
  Administered 2015-08-08: 25 mg via INTRAVENOUS
  Filled 2015-08-08: qty 1

## 2015-08-08 NOTE — Consult Note (Signed)
Neonatology Consult to Antenatal Patient: 08/08/2015 1:52 PM    I was requested by Dr Claiborne Billingsallahan to see this patient in order to provide antenatal counseling due to PPROM at [redacted] weeks gestation.    Stacy Underwood is a  29 y/o G2P0 who was admitted on 12/14 and is now [redacted] weeks GA.   Pregnancy complicated by cervical incompetence and uncontrolled pregestational diabetes mellitus.  She has 2 cerclage stitches in place and concern is that is she contintues to be in labor that the stitches need to be removed in order to prevent further damage to her weak cervix.   I spoke with Stacy Underwood and FOB as well as maternal mother in Room 172.  They are all aware that given her gestational age, infant is remote from viability at this time.  They are aware that if she delivers at this time we cannot offer resuscitation because infant is non-viable.  Stacy. Leavy Underwood asked what happens to the body of the infant when it is born non-viable.  I informed her that in such cases, she will be able to hold the infant until such time that it will be transferred to the morgue and arrangements will be made after that.   We discussed in detail reason why we only resuscitate infants [redacted] weeks gestation and above as well as what to expect including morbidity and mortality at that gestational age.  Discussed pulmonary anatomy related to the respiratory complications, risk for IVH with the potential for motor/cognitive deficits, as well as sepsis (PPROM) and other complications thus very poor prognosis.  They were attentive, had appropriate questions, and expressed appreciation for my input.     Thank you for asking us to see this patient.  Please call if there are any further questions.   Overton MamMary Ann T Christobal Morado, MD (Attending Neonatologist)  Total length of face-to-face or floor/unit time for this encounter was 30 minutes. Counseling and/or coordination of care was greater than fifty percent of the time

## 2015-08-08 NOTE — Progress Notes (Signed)
Met with patient this morning to discuss plan.  Pt had been previously counseled by Dr. Harrington Challenger when brought in with painful contraction and ROM.  Ctx subsided and cerclages x 2 were left in place.  Pt has been maintained on a clear diet pending decision to proceed to cerclage removal and IOL for previable delivery vs. expectant management.  Discussed pt with MFM Dr. Burnett Harry as well who concurs that it is patient choice and no interventions will be started until [redacted] weeks gestation.  Pt was waiting to discuss potential outcomes with NICU prior to making decision.  As of this time, pt and husband are still considering options.  Will continue qshift FHT and q4 hr blood sugar checks.  If remains acontractile and wants to proceed with exp. mgmt, will consider advancing diet and restarting insulin regimen [39] 0-19-19 [14] or sliding scale.  If remains stable for several days may consider discharge home with plan for readmission at [place].

## 2015-08-08 NOTE — Progress Notes (Signed)
Pt and husband have considered their options and have no further questions.  They have decided to proceed with IOL and removal of cerclage but request that this be done in the morning by Dr. Henderson CloudHorvath.  Advised pt can continue to monitor over night and if develops ctx, fever, bleeding would recommend removal of cerclage and proceeding with delivery, otherwise delay until am ok.  Pt will continue to have epidural and be on clear liquids.  Is having BS checks q 4 hrs, last check 105.  Not on insulin currently.

## 2015-08-08 NOTE — Progress Notes (Signed)
Pt encouraged to turn frequently with epidural in place. Patient refusing to turn at this time. States she will turn in an hour. Educated about decubitus ulcers and risks of not turning. SCD's on.

## 2015-08-09 ENCOUNTER — Encounter (HOSPITAL_COMMUNITY): Payer: Self-pay | Admitting: *Deleted

## 2015-08-09 LAB — GLUCOSE, CAPILLARY
GLUCOSE-CAPILLARY: 104 mg/dL — AB (ref 65–99)
GLUCOSE-CAPILLARY: 130 mg/dL — AB (ref 65–99)
GLUCOSE-CAPILLARY: 134 mg/dL — AB (ref 65–99)
GLUCOSE-CAPILLARY: 87 mg/dL (ref 65–99)
GLUCOSE-CAPILLARY: 95 mg/dL (ref 65–99)
Glucose-Capillary: 105 mg/dL — ABNORMAL HIGH (ref 65–99)
Glucose-Capillary: 126 mg/dL — ABNORMAL HIGH (ref 65–99)

## 2015-08-09 MED ORDER — MEASLES, MUMPS & RUBELLA VAC ~~LOC~~ INJ
0.5000 mL | INJECTION | Freq: Once | SUBCUTANEOUS | Status: DC
  Filled 2015-08-09: qty 0.5

## 2015-08-09 MED ORDER — MAGNESIUM HYDROXIDE 400 MG/5ML PO SUSP: 30.0000 mL | ORAL | Status: DC | PRN

## 2015-08-09 MED ORDER — IBUPROFEN 800 MG PO TABS
800.0000 mg | ORAL_TABLET | Freq: Three times a day (TID) | ORAL | Status: DC
  Administered 2015-08-09 – 2015-08-10 (×3): 800 mg via ORAL
  Filled 2015-08-09 (×3): qty 1

## 2015-08-09 MED ORDER — WITCH HAZEL-GLYCERIN EX PADS: 1.0000 "application " | MEDICATED_PAD | CUTANEOUS | Status: DC | PRN

## 2015-08-09 MED ORDER — TERBUTALINE SULFATE 1 MG/ML IJ SOLN: 0.2500 mg | Freq: Once | INTRAMUSCULAR | Status: DC | PRN

## 2015-08-09 MED ORDER — OXYTOCIN 40 UNITS IN LACTATED RINGERS INFUSION - SIMPLE MED
1.0000 m[IU]/min | INTRAVENOUS | Status: DC
Start: 2015-08-09 — End: 2015-08-09
  Administered 2015-08-09: 6 m[IU]/min via INTRAVENOUS

## 2015-08-09 MED ORDER — SODIUM CHLORIDE 0.9 % IJ SOLN: 3.0000 mL | INTRAMUSCULAR | Status: DC | PRN

## 2015-08-09 MED ORDER — LANOLIN HYDROUS EX OINT: TOPICAL_OINTMENT | CUTANEOUS | Status: DC | PRN

## 2015-08-09 MED ORDER — TETANUS-DIPHTH-ACELL PERTUSSIS 5-2.5-18.5 LF-MCG/0.5 IM SUSP
0.5000 mL | Freq: Once | INTRAMUSCULAR | Status: DC
Start: 2015-08-10 — End: 2015-08-10

## 2015-08-09 MED ORDER — ZOLPIDEM TARTRATE 5 MG PO TABS: 5.0000 mg | ORAL_TABLET | Freq: Every evening | ORAL | Status: DC | PRN

## 2015-08-09 MED ORDER — INSULIN ASPART 100 UNIT/ML ~~LOC~~ SOLN
10.0000 [IU] | Freq: Two times a day (BID) | SUBCUTANEOUS | Status: DC
  Administered 2015-08-09: 10 [IU] via SUBCUTANEOUS

## 2015-08-09 MED ORDER — ONDANSETRON HCL 4 MG PO TABS: 4.0000 mg | ORAL_TABLET | ORAL | Status: DC | PRN

## 2015-08-09 MED ORDER — BENZOCAINE-MENTHOL 20-0.5 % EX AERO: 1.0000 "application " | INHALATION_SPRAY | CUTANEOUS | Status: DC | PRN

## 2015-08-09 MED ORDER — METHYLERGONOVINE MALEATE 0.2 MG/ML IJ SOLN: 0.2000 mg | INTRAMUSCULAR | Status: DC | PRN

## 2015-08-09 MED ORDER — SIMETHICONE 80 MG PO CHEW: 80.0000 mg | CHEWABLE_TABLET | ORAL | Status: DC | PRN

## 2015-08-09 MED ORDER — ACETAMINOPHEN 325 MG PO TABS: 650.0000 mg | ORAL_TABLET | ORAL | Status: DC | PRN

## 2015-08-09 MED ORDER — SODIUM CHLORIDE 0.9 % IV SOLN
2.0000 g | Freq: Three times a day (TID) | INTRAVENOUS | Status: DC
  Administered 2015-08-09: 2 g via INTRAVENOUS
  Filled 2015-08-09 (×2): qty 2000

## 2015-08-09 MED ORDER — INSULIN NPH (HUMAN) (ISOPHANE) 100 UNIT/ML ~~LOC~~ SUSP
19.0000 [IU] | Freq: Every day | SUBCUTANEOUS | Status: DC
  Filled 2015-08-09: qty 10

## 2015-08-09 MED ORDER — PRENATAL MULTIVITAMIN CH: 1.0000 | ORAL_TABLET | Freq: Every day | ORAL | Status: DC

## 2015-08-09 MED ORDER — OXYCODONE-ACETAMINOPHEN 5-325 MG PO TABS: 1.0000 | ORAL_TABLET | ORAL | Status: DC | PRN

## 2015-08-09 MED ORDER — INSULIN ASPART 100 UNIT/ML ~~LOC~~ SOLN: 10.0000 [IU] | Freq: Two times a day (BID) | SUBCUTANEOUS | Status: DC

## 2015-08-09 MED ORDER — GENTAMICIN SULFATE 40 MG/ML IJ SOLN
500.0000 mg | INTRAVENOUS | Status: DC
  Administered 2015-08-09: 500 mg via INTRAVENOUS
  Filled 2015-08-09 (×2): qty 12.5

## 2015-08-09 MED ORDER — DIPHENHYDRAMINE HCL 25 MG PO CAPS: 25.0000 mg | ORAL_CAPSULE | Freq: Four times a day (QID) | ORAL | Status: DC | PRN

## 2015-08-09 MED ORDER — DIBUCAINE 1 % RE OINT: 1.0000 "application " | TOPICAL_OINTMENT | RECTAL | Status: DC | PRN

## 2015-08-09 MED ORDER — SODIUM CHLORIDE 0.9 % IJ SOLN: 3.0000 mL | Freq: Two times a day (BID) | INTRAMUSCULAR | Status: DC

## 2015-08-09 MED ORDER — SODIUM CHLORIDE 0.9 % IV SOLN
2.0000 g | Freq: Four times a day (QID) | INTRAVENOUS | Status: DC
  Administered 2015-08-09 – 2015-08-10 (×2): 2 g via INTRAVENOUS
  Filled 2015-08-09 (×4): qty 2000

## 2015-08-09 MED ORDER — OXYCODONE-ACETAMINOPHEN 5-325 MG PO TABS: 2.0000 | ORAL_TABLET | ORAL | Status: DC | PRN

## 2015-08-09 MED ORDER — FERROUS SULFATE 325 (65 FE) MG PO TABS
325.0000 mg | ORAL_TABLET | Freq: Two times a day (BID) | ORAL | Status: DC
  Administered 2015-08-09: 325 mg via ORAL
  Filled 2015-08-09: qty 1

## 2015-08-09 MED ORDER — METHYLERGONOVINE MALEATE 0.2 MG PO TABS: 0.2000 mg | ORAL_TABLET | ORAL | Status: DC | PRN

## 2015-08-09 MED ORDER — SODIUM CHLORIDE 0.9 % IV SOLN: 250.0000 mL | INTRAVENOUS | Status: DC | PRN

## 2015-08-09 MED ORDER — SENNOSIDES-DOCUSATE SODIUM 8.6-50 MG PO TABS: 2.0000 | ORAL_TABLET | ORAL | Status: DC

## 2015-08-09 MED ORDER — ONDANSETRON HCL 4 MG/2ML IJ SOLN
4.0000 mg | INTRAMUSCULAR | Status: DC | PRN
Start: 2015-08-09 — End: 2015-08-10

## 2015-08-09 MED ORDER — INSULIN NPH (HUMAN) (ISOPHANE) 100 UNIT/ML ~~LOC~~ SUSP
7.0000 [IU] | Freq: Every day | SUBCUTANEOUS | Status: DC
  Administered 2015-08-09: 7 [IU] via SUBCUTANEOUS

## 2015-08-09 NOTE — Progress Notes (Signed)
29 y.o. G2P0010 7553w1d HD#2 admitted for 21WKS,BLEEDING, ROM after second cerclage  Pt currently stable with no c/o.  Good FM.  Filed Vitals:   08/09/15 0731 08/09/15 0801  BP: 152/75 141/82  Pulse: 96 108  Temp:  98.7 F (37.1 C)  Resp:  18     Lungs CTA Cor RRR Abd  Soft, gravid, nontender Ex SCDs FHTs  present Toco  occ  Results for orders placed or performed during the hospital encounter of 08/07/15 (from the past 24 hour(s))  Glucose, capillary     Status: Abnormal   Collection Time: 08/08/15 11:24 AM  Result Value Ref Range   Glucose-Capillary 162 (H) 65 - 99 mg/dL  Glucose, capillary     Status: Abnormal   Collection Time: 08/08/15  3:25 PM  Result Value Ref Range   Glucose-Capillary 105 (H) 65 - 99 mg/dL  Glucose, capillary     Status: None   Collection Time: 08/08/15  8:53 PM  Result Value Ref Range   Glucose-Capillary 94 65 - 99 mg/dL  Glucose, capillary     Status: None   Collection Time: 08/08/15 11:58 PM  Result Value Ref Range   Glucose-Capillary 87 65 - 99 mg/dL  Glucose, capillary     Status: None   Collection Time: 08/09/15  3:57 AM  Result Value Ref Range   Glucose-Capillary 95 65 - 99 mg/dL  Glucose, capillary     Status: Abnormal   Collection Time: 08/09/15  8:05 AM  Result Value Ref Range   Glucose-Capillary 104 (H) 65 - 99 mg/dL    A:  HD#2  6553w1d with ROM after second cerclage, type 2 DM.  P: After discussion with Neo, pt had elected to have stitch removed and induction of labor.   Paraskevi Funez A

## 2015-08-09 NOTE — Progress Notes (Signed)
ANTIBIOTIC CONSULT NOTE - INITIAL  Pharmacy Consult for Gentamicin Extended Interval dosing Indication: Chorioamnionitis  No Known Allergies  Patient Measurements: Height: 5\' 5"  (165.1 cm) Weight: 234 lb (106.142 kg) IBW/kg (Calculated) : 57 Adjusted Body Weight: 71.7 kg  Vital Signs: Temp: 99.8 F (37.7 C) (12/16 1245) Temp Source: Oral (12/16 1245) BP: 121/70 mmHg (12/16 1231) Pulse Rate: 96 (12/16 1231) Intake/Output from previous day: 12/15 0701 - 12/16 0700 In: 3897.9 [I.V.:3897.9] Out: 1350 [Urine:1350] Intake/Output from this shift:    Labs:  Recent Labs  08/07/15 1155 08/08/15 0540  WBC 14.1* 16.3*  HGB 13.0 10.6*  PLT 429* 339   Estimated Creatinine Clearance: 127.7 mL/min (by C-G formula based on Cr of 0.45). No results for input(s): VANCOTROUGH, VANCOPEAK, VANCORANDOM, GENTTROUGH, GENTPEAK, GENTRANDOM, TOBRATROUGH, TOBRAPEAK, TOBRARND, AMIKACINPEAK, AMIKACINTROU, AMIKACIN in the last 72 hours.   Microbiology: No results found for this or any previous visit (from the past 720 hour(s)).  Medical History: Past Medical History  Diagnosis Date  . Medical history non-contributory   . Diabetes mellitus without complication (HCC)   . Anxiety   . Depression     Medications: Ampicillin 2 g IV Q6 hr  Assessment: Pt. s/p delivery @ 21 1/7 weeks w/ fetal demise.  Has low grade temp with copious amounts of foul, thick discharge from cervix.   Goal of Therapy: Refer to [facility] Extended [facility]val Dosing Nom[facility].   Plan:  1) Load 7mg /kg, Gentamicin 500mg  IV Q24. 2) Check 10 hour level tonight and adjust as appropriate. Hovey-Rankin, Laiza Veenstra 08/09/2015,12:54 PM

## 2015-08-09 NOTE — Progress Notes (Signed)
Care handoff given to Stoney BangNatalie Deal, RN.

## 2015-08-09 NOTE — Progress Notes (Signed)
I spent time with Stacy Underwood and her SO Casimiro NeedleMichael to offer emotional, spiritual and grief support.  They requested resources for ongoing support which we provided them with.  Also assisted nurses with comfort care for baby, especially as baby continued to have a heartbeat for extended time after delivery.  Centex CorporationChaplain Katy Jaya Lapka Pager, [phone] 2:41 PM    08/09/15 1400  Clinical Encounter Type  Visited With Patient;Patient and family together;Health care provider  Visit Type Spiritual support  Referral From Nurse  Consult/Referral To ([facility])  Spiritual Encounters  Spiritual Needs Emotional;Grief support  Stress Factors  Patient Stress Factors Loss (Perinatal loss)  Family Stress Factors (Perinatal loss)

## 2015-08-09 NOTE — Progress Notes (Signed)
Stitch knot remove- small amount of ticron left in cervix but cervix dilated immediately to 4 cm.   Fetal presenting part applied to cervix.  Copious amounts of foul thick pus coming from cervix before removal- likely infected.    Start pitocin.

## 2015-08-10 LAB — CBC
HCT: 32.1 % — ABNORMAL LOW (ref 36.0–46.0)
HEMOGLOBIN: 10.5 g/dL — AB (ref 12.0–15.0)
MCH: 26.6 pg (ref 26.0–34.0)
MCHC: 32.7 g/dL (ref 30.0–36.0)
MCV: 81.3 fL (ref 78.0–100.0)
PLATELETS: 365 10*3/uL (ref 150–400)
RBC: 3.95 MIL/uL (ref 3.87–5.11)
RDW: 13.3 % (ref 11.5–15.5)
WBC: 14.1 10*3/uL — AB (ref 4.0–10.5)

## 2015-08-10 LAB — GLUCOSE, CAPILLARY
GLUCOSE-CAPILLARY: 175 mg/dL — AB (ref 65–99)
Glucose-Capillary: 140 mg/dL — ABNORMAL HIGH (ref 65–99)

## 2015-08-10 MED ORDER — INSULIN ASPART 100 UNIT/ML ~~LOC~~ SOLN: 10.0000 [IU] | Freq: Two times a day (BID) | SUBCUTANEOUS | Status: AC

## 2015-08-10 MED ORDER — INSULIN LISPRO 100 UNIT/ML ~~LOC~~ SOLN: 7.0000 [IU] | Freq: Two times a day (BID) | SUBCUTANEOUS | Status: AC

## 2015-08-10 MED ORDER — INSULIN NPH (HUMAN) (ISOPHANE) 100 UNIT/ML ~~LOC~~ SUSP: 7.0000 [IU] | Freq: Two times a day (BID) | SUBCUTANEOUS | Status: AC

## 2015-08-10 NOTE — Progress Notes (Signed)
Patient is eating, ambulating, voiding.  Pain control is good.  Filed Vitals:   08/09/15 1800 08/09/15 2115 08/10/15 0240 08/10/15 0605  BP: 126/68 99/55 132/78 99/63  Pulse: 108 92 89 88  Temp: 99.8 F (37.7 C) 98.2 F (36.8 C) 97.7 F (36.5 C) 98.3 F (36.8 C)  TempSrc: Oral Oral Oral Oral  Resp: 24 22 16 14   Height:      Weight:      SpO2: 99% 99% 99% 100%    Fundus firm Perineum without swelling.  Results for orders placed or performed during the hospital encounter of 08/07/15 (from the past 24 hour(s))  Glucose, capillary     Status: Abnormal   Collection Time: 08/09/15 12:03 PM  Result Value Ref Range   Glucose-Capillary 105 (H) 65 - 99 mg/dL  Glucose, capillary     Status: Abnormal   Collection Time: 08/09/15  2:06 PM  Result Value Ref Range   Glucose-Capillary 126 (H) 65 - 99 mg/dL  Glucose, capillary     Status: Abnormal   Collection Time: 08/09/15  6:13 PM  Result Value Ref Range   Glucose-Capillary 134 (H) 65 - 99 mg/dL  Glucose, capillary     Status: Abnormal   Collection Time: 08/09/15 10:29 PM  Result Value Ref Range   Glucose-Capillary 130 (H) 65 - 99 mg/dL  Glucose, capillary     Status: Abnormal   Collection Time: 08/10/15  2:39 AM  Result Value Ref Range   Glucose-Capillary 175 (H) 65 - 99 mg/dL  CBC     Status: Abnormal   Collection Time: 08/10/15  5:17 AM  Result Value Ref Range   WBC 14.1 (H) 4.0 - 10.5 K/uL   RBC 3.95 3.87 - 5.11 MIL/uL   Hemoglobin 10.5 (L) 12.0 - 15.0 g/dL   HCT 16.132.1 (L) 09.636.0 - 04.546.0 %   MCV 81.3 78.0 - 100.0 fL   MCH 26.6 26.0 - 34.0 pg   MCHC 32.7 30.0 - 36.0 g/dL   RDW 40.913.3 81.111.5 - 91.415.5 %   Platelets 365 150 - 400 K/uL  Glucose, capillary     Status: Abnormal   Collection Time: 08/10/15  6:14 AM  Result Value Ref Range   Glucose-Capillary 140 (H) 65 - 99 mg/dL    --/--/O POS (78/2912/14 5621)/[id number])/RI  A/P Post partum day 1 from ROM, chorio and delivery of 21 week fetus.  S/p antibiotics for fever and chorio.   Routine  care- pt can take half dose of previous insulin and record sugars.  Expect d/c today.    Abed Schar A

## 2015-08-10 NOTE — Progress Notes (Signed)
D/c instructions and prescriptions reviewed with patient. Has f/u appt in 2 weeks with MD. Pt d/c home stable, ambulatory with family to private car.

## 2015-08-10 NOTE — Discharge Summary (Signed)
Obstetric Discharge Summary Reason for Admission: rupture of membranes  After rescue cerclage Prenatal Procedures: ultrasound and cerclage Intrapartum Procedures: spontaneous vaginal delivery and removal of cerclage Postpartum Procedures: antibiotics Complications-Operative and Postpartum: none HEMOGLOBIN  Date Value Ref Range Status  08/10/2015 10.5* 12.0 - 15.0 g/dL Final   HCT  Date Value Ref Range Status  08/10/2015 32.1* 36.0 - 46.0 % Final     Discharge Diagnoses: Incompetent cervix, Premature labor and PROM x12 hours  Discharge Information: Date: 08/10/2015 Activity: pelvic rest Diet: routine Medications: Ibuprofen and insulin Condition: stable Instructions: routine for loss Discharge to: home Follow-up Information    Follow up with Vermon Grays A, MD.   Specialty:  Obstetrics and Gynecology   Contact information:   [address] [place] [address] [phone]       Newborn Data: Live born female expired after birth Birth Weight: 10.6 oz (301 g) APGAR: 2, 2    Stacy Underwood A 08/10/2015, 9:53 AM

## 2015-08-10 NOTE — Anesthesia Postprocedure Evaluation (Signed)
Anesthesia Post Note  Patient: Amie PortlandJasmine R Evetts  Procedure(s) Performed: * No procedures listed *  Patient location during evaluation: Mother Baby Anesthesia Type: Epidural Level of consciousness: awake and alert and oriented Pain management: pain level controlled Vital Signs Assessment: post-procedure vital signs reviewed and stable Respiratory status: spontaneous breathing and nonlabored ventilation Cardiovascular status: stable Postop Assessment: no headache, no backache, patient able to bend at knees, no signs of nausea or vomiting and adequate PO intake Anesthetic complications: no    Last Vitals:  Filed Vitals:   08/10/15 0240 08/10/15 0605  BP: 132/78 99/63  Pulse: 89 88  Temp: 36.5 C 36.8 C  Resp: 16 14    Last Pain:  Filed Vitals:   08/10/15 0824  PainSc: 0-No pain                 Ashlea Dusing

## 2015-08-13 ENCOUNTER — Ambulatory Visit (HOSPITAL_COMMUNITY): Payer: Managed Care, Other (non HMO)

## 2015-08-20 ENCOUNTER — Ambulatory Visit (HOSPITAL_COMMUNITY): Payer: Managed Care, Other (non HMO)

## 2015-08-27 ENCOUNTER — Ambulatory Visit (HOSPITAL_COMMUNITY): Payer: Managed Care, Other (non HMO)

## 2015-09-03 ENCOUNTER — Ambulatory Visit (HOSPITAL_COMMUNITY): Payer: Managed Care, Other (non HMO)

## 2015-09-10 ENCOUNTER — Ambulatory Visit (HOSPITAL_COMMUNITY): Payer: Managed Care, Other (non HMO)

## 2015-09-17 ENCOUNTER — Ambulatory Visit (HOSPITAL_COMMUNITY): Payer: Managed Care, Other (non HMO)

## 2015-09-24 ENCOUNTER — Ambulatory Visit (HOSPITAL_COMMUNITY): Payer: Managed Care, Other (non HMO)

## 2015-10-01 ENCOUNTER — Ambulatory Visit (HOSPITAL_COMMUNITY): Payer: Managed Care, Other (non HMO)

## 2015-12-21 IMAGING — US US MFM OB TRANSVAGINAL
1 series · 13 of 28 positions shown · non-contrast
Comparison: none

[Series 1: us mfm ob transvaginal · 0.21mm/px · 13 of 28 slices shown]
[im 2/28]
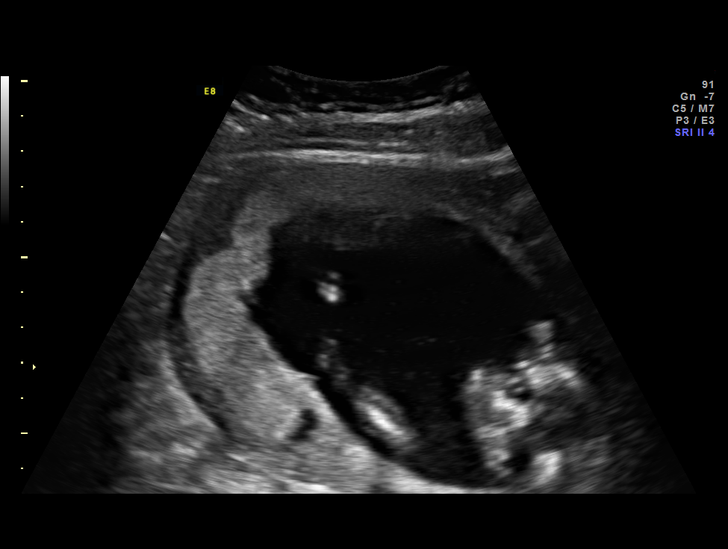
[im 4/28]
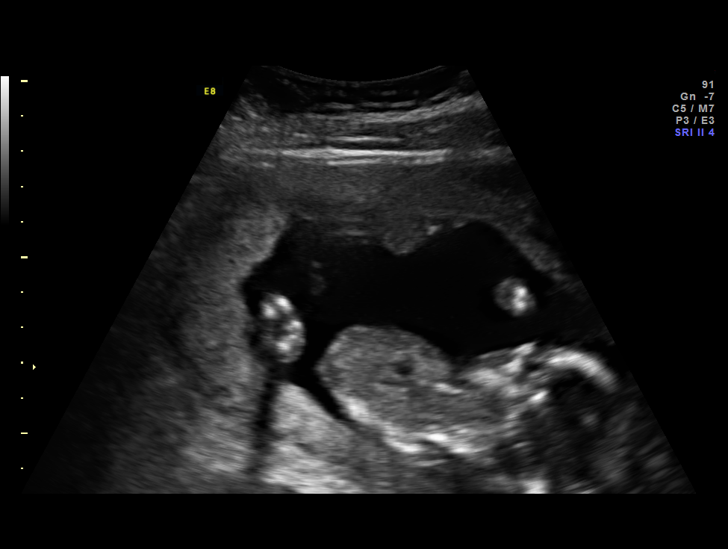
[im 6/28]
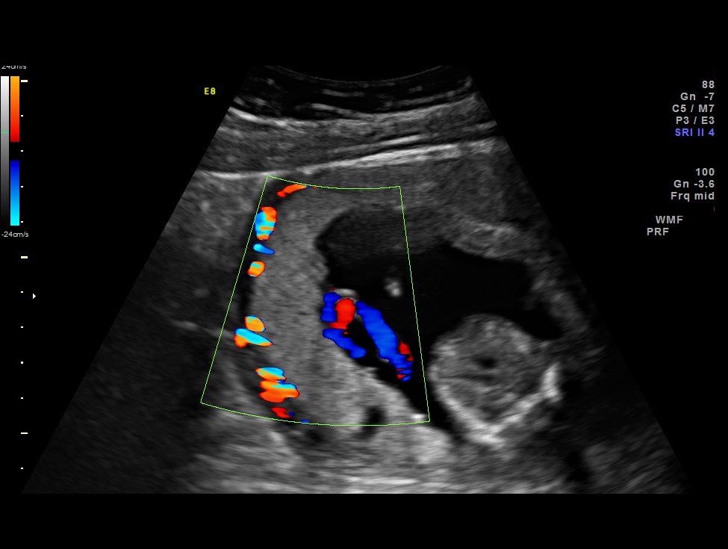
[im 8/28]
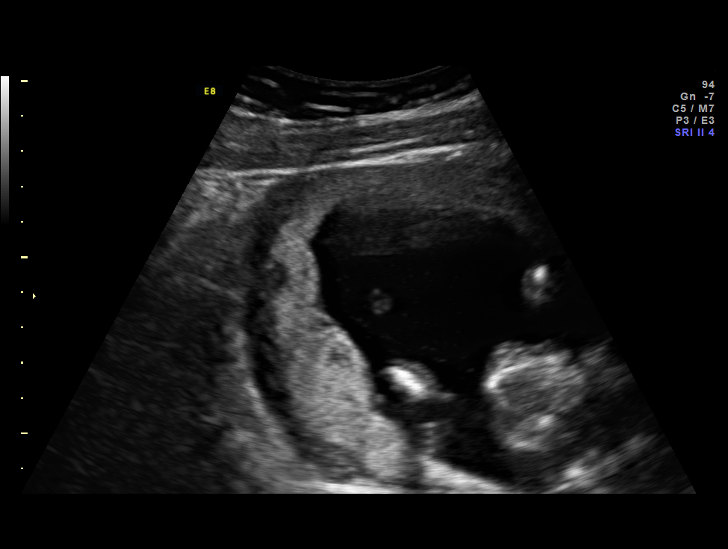
[im 10/28]
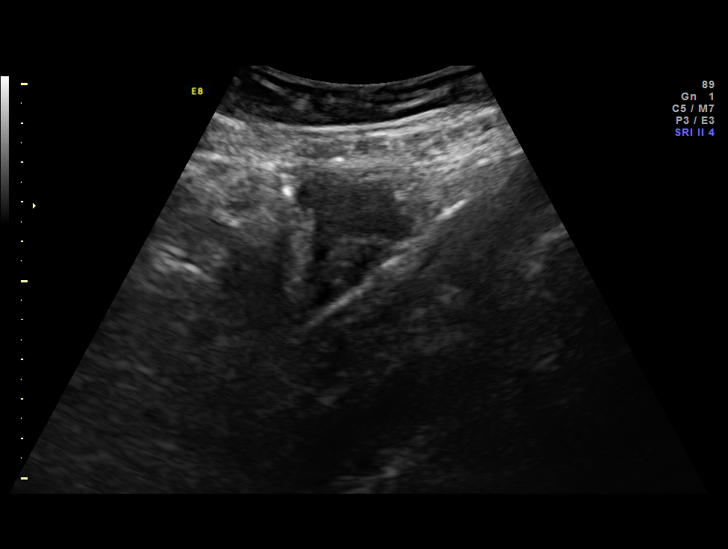
[im 12/28]
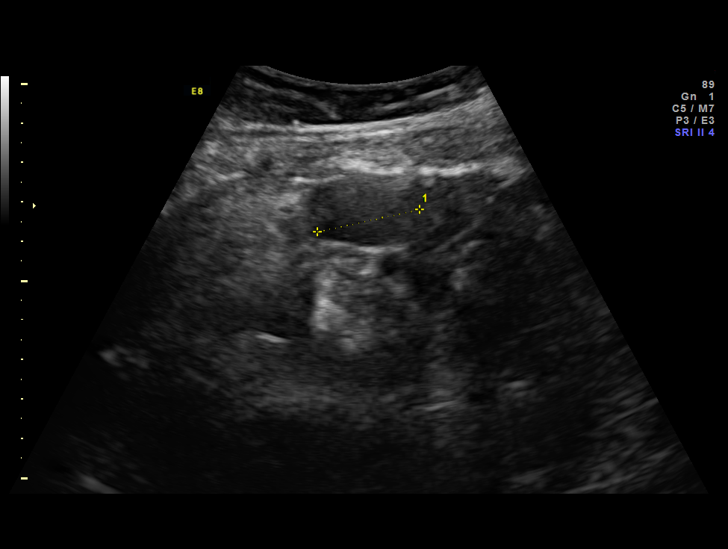
[im 15/28]
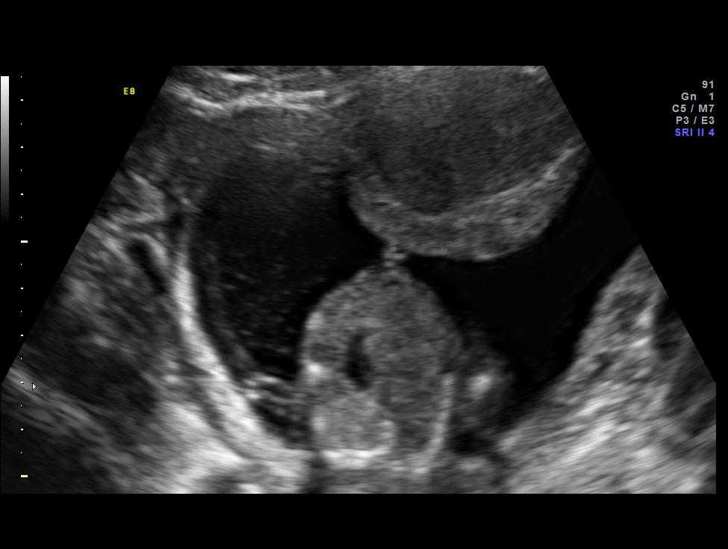
[im 17/28]
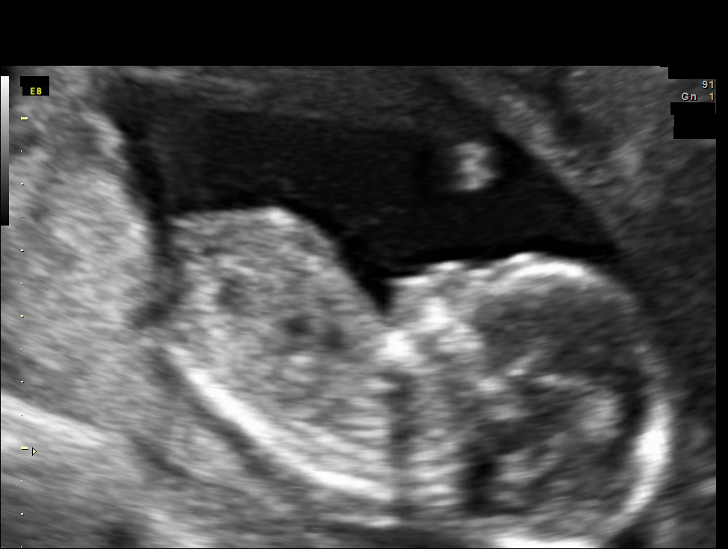
[im 19/28]
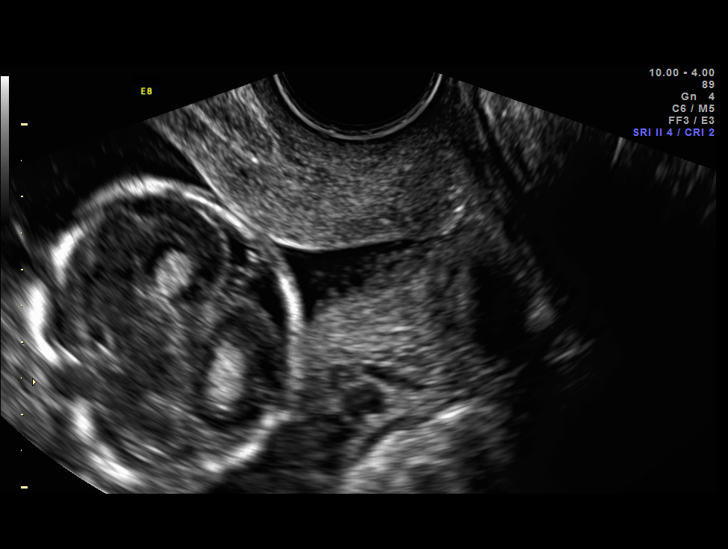
[im 21/28]
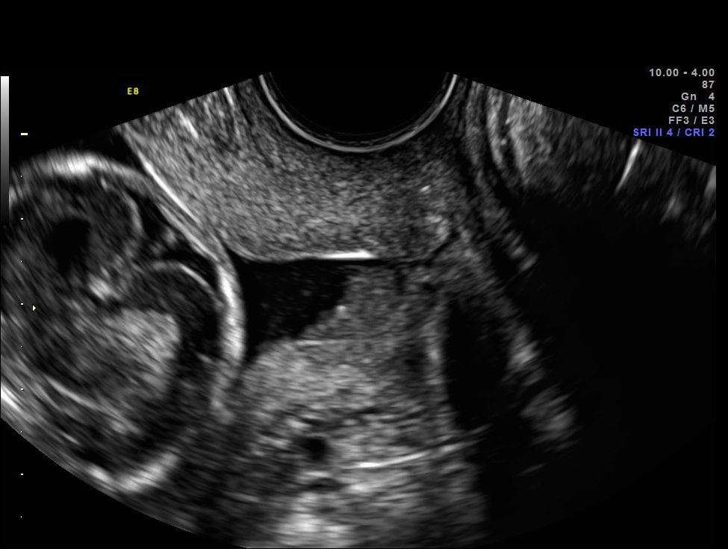
[im 23/28]
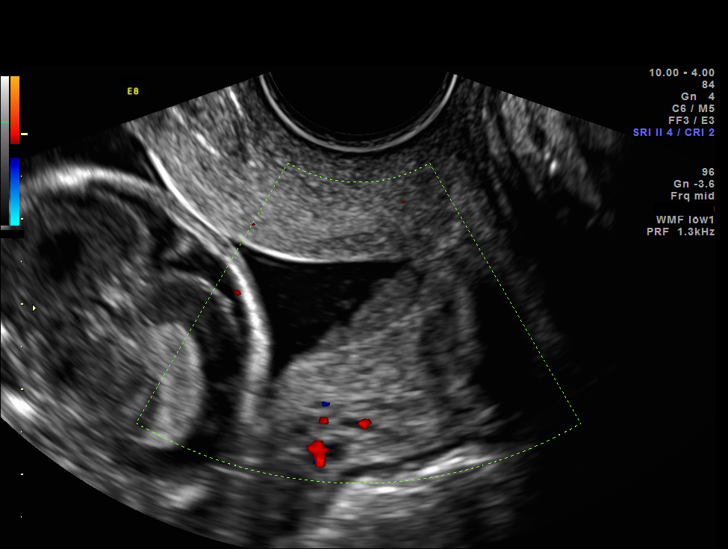
[im 25/28]
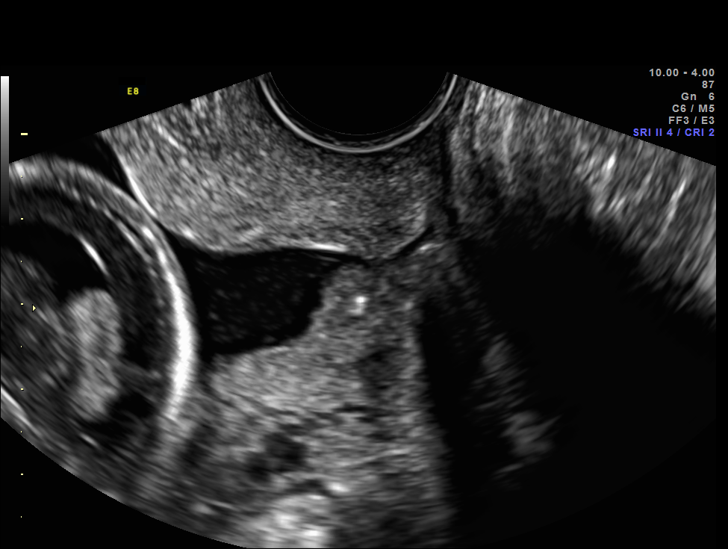
[im 27/28]
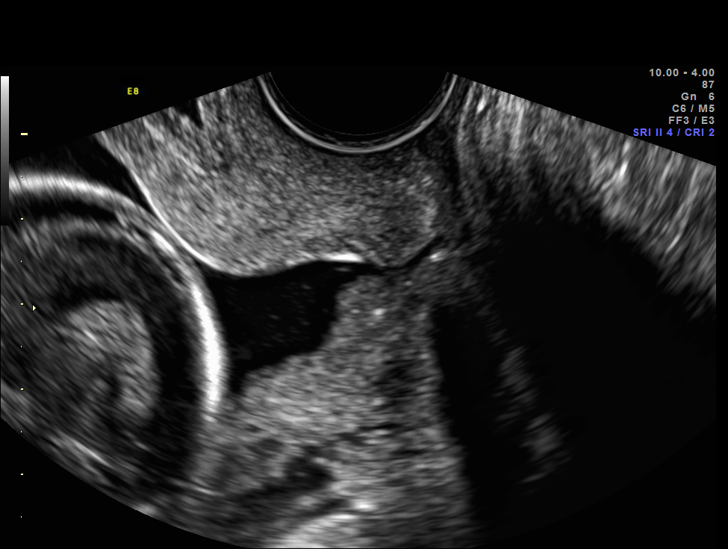

[13 of 28 positions shown; findings below may reference images not displayed]

OBSTETRICS REPORT
(Signed Final 07/16/2015 [DATE])

Date:

By:
Service(s) Provided

US MFM OB TRANSVAGINAL                                 76817.2
Indications

17 weeks gestation of pregnancy
Cervical shortening, second trimester-on vaginal
progesterone
Gestational diabetes in pregnancy, diet
controlled(AIC 10.5 on 06/11/15)
Fetal Evaluation

Num Of             1
Fetuses:
Fetal Heart        167                          bpm
Rate:
Cardiac Activity:  Observed
Presentation:      Cephalic
Placenta:          Fundal, above cervical os
P. Cord            Visualized
Insertion:

Amniotic Fluid
AFI FV:      Subjectively within normal limits
Larg Pckt:      5.0  cm
Gestational Age

Best:          17w 5d    Det. By:   Early Ultrasound          EDD:   12/19/15
(05/14/15)
Cervix Uterus Adnexa

Cervical Length:    0.74      cm

Cervix:       U-shaped Funneling of internal os noted with
internal debri

Left Ovary:    Within normal limits.
Right Ovary:   Within normal limits.
Impression

Single IUP at 17w 5d
Pre-existing diabetes (AbJ76 10.5)

Transvaginal ultrasound - cervical length 0.74 cm.  U-
shaped funneling noted.  Some cervical debris is
appreciated.

Ultrasound findings and limitations discussed.
Recommend ultrasound indicated cervical cerclage given
findings.
Recommendations

Discussed recommendations with Dr. Sorin
Plan ultrasound indicated cerclage today.

Consider brief admission for diabetic patterning if the
patient is amendable.
Ultrasound for cervical length in 1 week (post cerclage)
Will need fetal echo at approximately 20-22 weeks
Ultrasound for growth in 3 weeks
